# Patient Record
Sex: Male | Born: 1945 | Race: White | Hispanic: No | Marital: Married | Smoking: Current some day smoker
Health system: Southern US, Community
[De-identification: ages and names within clinical notes are randomized; demographics above are authoritative.]

## PROBLEM LIST (undated history)

## (undated) DIAGNOSIS — E785 Hyperlipidemia, unspecified: Secondary | ICD-10-CM

## (undated) HISTORY — PX: HIP SURGERY: SHX245

## (undated) HISTORY — PX: TONSILLECTOMY: SUR1361

## (undated) HISTORY — DX: Hyperlipidemia, unspecified: E78.5

---

## 2005-01-09 ENCOUNTER — Encounter (INDEPENDENT_AMBULATORY_CARE_PROVIDER_SITE_OTHER): Payer: Self-pay | Admitting: *Deleted

## 2005-01-09 ENCOUNTER — Ambulatory Visit (HOSPITAL_COMMUNITY): Admission: RE | Admit: 2005-01-09 | Discharge: 2005-01-09 | Payer: Self-pay | Admitting: Gastroenterology

## 2010-03-26 ENCOUNTER — Ambulatory Visit: Payer: Self-pay | Admitting: Emergency Medicine

## 2010-03-26 DIAGNOSIS — S81809A Unspecified open wound, unspecified lower leg, initial encounter: Secondary | ICD-10-CM

## 2010-03-26 DIAGNOSIS — S91009A Unspecified open wound, unspecified ankle, initial encounter: Secondary | ICD-10-CM

## 2010-03-26 DIAGNOSIS — S81009A Unspecified open wound, unspecified knee, initial encounter: Secondary | ICD-10-CM | POA: Insufficient documentation

## 2010-04-04 ENCOUNTER — Ambulatory Visit: Payer: Self-pay | Admitting: Emergency Medicine

## 2010-09-25 ENCOUNTER — Ambulatory Visit: Payer: Self-pay | Admitting: Emergency Medicine

## 2010-09-25 DIAGNOSIS — N453 Epididymo-orchitis: Secondary | ICD-10-CM | POA: Insufficient documentation

## 2010-09-25 LAB — CONVERTED CEMR LAB
Bilirubin Urine: NEGATIVE
Glucose, Urine, Semiquant: NEGATIVE
Nitrite: NEGATIVE
Specific Gravity, Urine: >=1.03
pH: 5

## 2010-09-26 ENCOUNTER — Encounter: Payer: Self-pay | Admitting: Emergency Medicine

## 2010-09-29 ENCOUNTER — Telehealth (INDEPENDENT_AMBULATORY_CARE_PROVIDER_SITE_OTHER): Payer: Self-pay | Admitting: *Deleted

## 2010-10-10 ENCOUNTER — Encounter
Admission: RE | Admit: 2010-10-10 | Discharge: 2010-10-10 | Payer: Self-pay | Source: Home / Self Care | Attending: Family Medicine | Admitting: Family Medicine

## 2010-11-04 NOTE — Assessment & Plan Note (Signed)
Summary: suture removal   Patient comes in today for suture removal.  His leg has healed up very well, no signs of infection.  Wound is closed with no dehiscence.  No tenderness, bruising and minimal erythema. Procedure: 5 sutures are removed with no difficulty. Patient advised to keep clean and dry and to return to clinic if any further problems.

## 2010-11-04 NOTE — Assessment & Plan Note (Signed)
Summary: Laceration- L Thigh x today proced rm   Vital Signs:  Patient Profile:   65 Years Old Male CC:      Laceration - L thigh x today Height:     72 inches Weight:      219 pounds O2 Sat:      100 % O2 treatment:    Room Air Temp:     97.1 degrees F oral Pulse rate:   80 / minute Pulse rhythm:   regular Resp:     16 per minute BP sitting:   135 / 90  (right arm) Cuff size:   regular  Vitals Entered By: Areta Haber CMA (March 26, 2010 6:12 PM)                  Current Allergies: No known allergies History of Present Illness Chief Complaint: Laceration - L thigh x today History of Present Illness: Cut left thigh with chainsaw today.  Last Td was 1 year ago.  Mild constant pain.  Bleeding has mostly stopped. No OTC's help, just compression.  Current Problems: OPEN WOUND KNEE LEG&ANK WITHOUT MENTION COMP (ICD-891.0)   Current Meds KEFLEX 500 MG CAPS (CEPHALEXIN) 1 tab by mouth two times a day x7 days  REVIEW OF SYSTEMS Constitutional Symptoms      Denies fever, chills, night sweats, weight loss, weight gain, and fatigue.  Eyes       Denies change in vision, eye pain, eye discharge, glasses, contact lenses, and eye surgery. Ear/Nose/Throat/Mouth       Denies hearing loss/aids, change in hearing, ear pain, ear discharge, dizziness, frequent runny nose, frequent nose bleeds, sinus problems, sore throat, hoarseness, and tooth pain or bleeding.  Respiratory       Denies dry cough, productive cough, wheezing, shortness of breath, asthma, bronchitis, and emphysema/COPD.  Cardiovascular       Denies murmurs, chest pain, and tires easily with exhertion.    Gastrointestinal       Denies stomach pain, nausea/vomiting, diarrhea, constipation, blood in bowel movements, and indigestion. Genitourniary       Denies painful urination, kidney stones, and loss of urinary control. Neurological       Denies paralysis, seizures, and fainting/blackouts. Musculoskeletal  Denies muscle pain, joint pain, joint stiffness, decreased range of motion, redness, swelling, muscle weakness, and gout.  Skin       Denies bruising, unusual mles/lumps or sores, and hair/skin or nail changes.  Psych       Denies mood changes, temper/anger issues, anxiety/stress, speech problems, depression, and sleep problems. Other Comments: Pt states he cut his L thight today on chainsaw. Pt states he had a Td last year.   Past History:  Past Medical History: Unremarkable  Past Surgical History: Dislocated hip  Social History: Married Current Smoker Alcohol use-no Drug use-no Regular exercise-yes Smoking Status:  current Drug Use:  no Does Patient Exercise:  yes Physical Exam General appearance: well developed, well nourished, no acute distress Chest/Lungs: no rales, wheezes, or rhonchi bilateral, breath sounds equal without effort Heart: regular rate and  rhythm, no murmur Extremities: normal extremities.  NOrmal function. Neurological: grossly intact and non-focal Skin: 6cm laceration anterior left distal thigh, not deep, only very minor bleeding.   Assessment New Problems: OPEN WOUND KNEE LEG&ANK WITHOUT MENTION COMP (ICD-891.0)   Plan New Medications/Changes: KEFLEX 500 MG CAPS (CEPHALEXIN) 1 tab by mouth two times a day x7 days  #14 x 0, 03/26/2010, Hoyt Koch MD  New Orders: New  Patient Level III [57846] Repair Laceration Intermediate 5.1-7.5 CM [12053]  The patient and/or caregiver has been counseled thoroughly with regard to medications prescribed including dosage, schedule, interactions, rationale for use, and possible side effects and they verbalize understanding.  Diagnoses and expected course of recovery discussed and will return if not improved as expected or if the condition worsens. Patient and/or caregiver verbalized understanding.   PROCEDURE:  Suture Site: left thigh Size: 6cm Number of Lacerations: 1 Procedure: Discussed benefits and  risks of procedure and verbal consent obtained.  Using sterile technique and local 1% lidocaine without epinephrine, cleansed wound with betadine followed by copious lavage with normal saline.  Wound carefully inspected for debris and foreign bodies; none found.  Wound closed with #5 , 3-0 interrupted nylon sutures.  Bacitracin and non-stick sterile dressing applied.  Wound precautions explained to patient.  Disposition: Return to clinic as Instructed by MD Prescriptions: KEFLEX 500 MG CAPS (CEPHALEXIN) 1 tab by mouth two times a day x7 days  #14 x 0   Entered and Authorized by:   Hoyt Koch MD   Signed by:   Hoyt Koch MD on 03/26/2010   Method used:   Print then Give to Patient   RxID:   [id number]   Patient Instructions: 1)  No getting the wound wet for 2 days, then may shower only.  Otherwise keep clean and dry.   2)  Stitches will need to be taken out in 10 days 3)  If developing an infection, increased pain, redness, or pus, start the antibiotics and call a physician  Orders Added: 1)  New Patient Level III [27253] 2)  Repair Laceration Intermediate 5.1-7.5 CM [12053]

## 2010-11-06 NOTE — Progress Notes (Signed)
  Phone Note Outgoing Call Call back at [facility] Phone [phone]   Call placed by: Lajean Saver RN,  September 29, 2010 5:45 PM Call placed to: Patient Summary of Call: CAllback: No answer. Message left with reason for call. Told patient to call back if still symptomatic.

## 2010-11-06 NOTE — Assessment & Plan Note (Signed)
Summary: "DISCOMFORT IN LOWER AREA"/TJ   Vital Signs:  Patient Profile:   65 Years Old Male CC:      discomfort in right testicle x last night Height:     72 inches Weight:      224 pounds O2 Sat:      96 % O2 treatment:    Room Air Temp:     98.9 degrees F oral Pulse rate:   67 / minute Resp:     18 per minute BP sitting:   142 / 90  (left arm) Cuff size:   large  Vitals Entered By: Lajean Saver RN (September 25, 2010 4:17 PM)                  Updated Prior Medication List: No Medications Current Allergies: No known allergies History of Present Illness History from: patient Chief Complaint: discomfort in right testicle x last night History of Present Illness: 64yo WM w/ R testicular pain since last night. No trauma. Not sexually active. Mild heavy lifting last week when helping a friend move.  He reports that the pain is mild-moderate and feels that he was "kicked in the groin".  Some mild difficulty starting urination and incomplete voiding.  No hematuria, no blood in the stool, no rectal pain.  Motrin helps a little.  No history of hernias.  REVIEW OF SYSTEMS Constitutional Symptoms      Denies fever, chills, night sweats, weight loss, weight gain, and fatigue.  Eyes       Denies change in vision, eye pain, eye discharge, glasses, contact lenses, and eye surgery. Ear/Nose/Throat/Mouth       Denies hearing loss/aids, change in hearing, ear pain, ear discharge, dizziness, frequent runny nose, frequent nose bleeds, sinus problems, sore throat, hoarseness, and tooth pain or bleeding.  Respiratory       Denies dry cough, productive cough, wheezing, shortness of breath, asthma, bronchitis, and emphysema/COPD.  Cardiovascular       Denies murmurs, chest pain, and tires easily with exhertion.    Gastrointestinal       Denies stomach pain, nausea/vomiting, diarrhea, constipation, blood in bowel movements, and indigestion. Genitourniary       Denies painful urination, blood  or discharge from penis, kidney stones, and loss of urinary control.      Comments: testicular pain- right sided Neurological       Denies paralysis, seizures, and fainting/blackouts. Musculoskeletal       Denies muscle pain, joint pain, joint stiffness, decreased range of motion, redness, swelling, muscle weakness, and gout.  Skin       Denies bruising, unusual mles/lumps or sores, and hair/skin or nail changes.  Psych       Denies mood changes, temper/anger issues, anxiety/stress, speech problems, depression, and sleep problems. Other Comments: Denies any urianry issues.   Past History:  Past Medical History: Reviewed history from 03/26/2010 and no changes required. Unremarkable  Social History: Reviewed history from 03/26/2010 and no changes required. Married Current Smoker Alcohol use-no Drug use-no Regular exercise-yes Physical Exam General appearance: well developed, well nourished, no acute distress Chest/Lungs: no rales, wheezes, or rhonchi bilateral, breath sounds equal without effort Heart: regular rate and  rhythm, no murmur GU: Mild TTP R epididymis, perhaps small firm nodule felt, no direct or indirect hernia felt Skin: no obvious rashes or lesions MSE: oriented to time, place, and person Assessment New Problems: EPIDIDYMITIS, RIGHT (ICD-604.90)  R testicular pain: DDx includes epididymitis vs hernia vs torsion vs kidney  stone  Patient Education: Patient and/or caregiver instructed in the following: rest, Ibuprofen prn.  Plan New Medications/Changes: CIPROFLOXACIN HCL 500 MG TABS (CIPROFLOXACIN HCL) 1 by mouth two times a day for 14 days  #28 x 8, 09/25/2010, Hoyt Koch MD  New Orders: Est. Patient Level III [phone] Planning Comments:   I suggested patient have a testicular ultrasound done to check on possible nodule and to r/o torsion.  He refused.  I gave him a Rx for Cipro to cover epipiymitis/orchitis/prostatitis/UTI as infectious etiologies.   Increase hydration and Ibuprofen prn for pain.  Please follow up with your PCP.  If pain worsening, needs to get Ultrasound +/- referral to urologist.  Urine culture pending.   The patient and/or caregiver has been counseled thoroughly with regard to medications prescribed including dosage, schedule, interactions, rationale for use, and possible side effects and they verbalize understanding.  Diagnoses and expected course of recovery discussed and will return if not improved as expected or if the condition worsens. Patient and/or caregiver verbalized understanding.  Prescriptions: CIPROFLOXACIN HCL 500 MG TABS (CIPROFLOXACIN HCL) 1 by mouth two times a day for 14 days  #28 x 8   Entered and Authorized by:   Hoyt Koch MD   Signed by:   Hoyt Koch MD on 09/25/2010   Method used:   Print then Give to Patient   RxID:   [id number]   Orders Added: 1)  Est. Patient Level III [95621]    Laboratory Results   Urine Tests  Date/Time Received: September 25, 2010 4:27 PM  Date/Time Reported: September 25, 2010 4:27 PM   Routine Urinalysis   Color: yellow Appearance: slightly cloudy with sediment Glucose: negative   (Normal Range: Negative) Bilirubin: negative   (Normal Range: Negative) Ketone: trace (5)   (Normal Range: Negative) Spec. Gravity: >=1.030   (Normal Range: 1.003-1.035) Blood: trace-lysed   (Normal Range: Negative) pH: 5.0   (Normal Range: 5.0-8.0) Protein: negative   (Normal Range: Negative) Urobilinogen: 0.2   (Normal Range: 0-1) Nitrite: negative   (Normal Range: Negative) Leukocyte Esterace: negative   (Normal Range: Negative)

## 2011-02-20 NOTE — Op Note (Signed)
NAME:  Sean Mullen, FEDEWA NO.:  [id number]   MEDICAL RECORD NO.:  [id number]          PATIENT TYPE:  AMB   LOCATION:  ENDO                         FACILITY:  [facility]   PHYSICIAN:  John C. Madilyn Fireman, M.D.    DATE OF BIRTH:  [birth year]   DATE OF PROCEDURE:  01/09/2005  DATE OF DISCHARGE:                                 OPERATIVE REPORT   PROCEDURE:  Colonoscopy with polypectomy.   INDICATIONS FOR PROCEDURE:  Average risk colon cancer screening.   PROCEDURE:  The patient was placed in the left lateral decubitus position  and placed on the pulse monitor with continuous low-flow oxygen delivered by  nasal cannula.  He was sedated with 100 mcg IV fentanyl and 10 mg IV Versed.  The Olympus video colonoscope was inserted into the rectum and advanced to  the cecum, confirmed by transillumination of McBurney's point and  visualization of the ileocecal valve and appendiceal orifice.  The prep was  excellent.  The cecum, ascending, transverse, descending colon all appeared  normal with no masses, polyps, diverticula or other mucosal abnormalities.  Within the sigmoid colon, there was an 8 mm polyp that was removed by hot  biopsy.  The remainder of the rectum appeared normal.  The scope was then  withdrawn and the patient returned to the recovery room in stable condition.  He tolerated the procedure well, and there were no immediate complications.   IMPRESSION:  Normal colonoscopy.   PLAN:  Await histology will probably repeat study in 3 years.      J[facility]/MEDQ  D:  01/09/2005  T:  01/09/2005  Job:  [id number]   cc:   Dellis Anes. Idell Pickles, M.D.  [address]  [place]  [place] [address]  Fax: [phone]

## 2012-08-04 ENCOUNTER — Encounter: Payer: Self-pay | Admitting: *Deleted

## 2012-08-04 ENCOUNTER — Emergency Department
Admission: EM | Admit: 2012-08-04 | Discharge: 2012-08-04 | Disposition: A | Discharge status: Home / Self Care | Payer: Medicare Other | Source: Home / Self Care | Attending: Family Medicine | Admitting: Family Medicine

## 2012-08-04 DIAGNOSIS — S336XXA Sprain of sacroiliac joint, initial encounter: Secondary | ICD-10-CM

## 2012-08-04 DIAGNOSIS — S335XXA Sprain of ligaments of lumbar spine, initial encounter: Secondary | ICD-10-CM

## 2012-08-04 MED ORDER — CYCLOBENZAPRINE HCL 10 MG PO TABS: ORAL_TABLET | ORAL | Status: DC

## 2012-08-04 MED ORDER — NAPROXEN 500 MG PO TABS: 500.0000 mg | ORAL_TABLET | Freq: Two times a day (BID) | ORAL | Status: DC

## 2012-08-04 NOTE — ED Provider Notes (Signed)
History     CSN: [id number]  Arrival date & time 08/04/12  1455   First MD Initiated Contact with Patient 08/04/12 1513      Chief Complaint  Patient presents with  . Back Pain      HPI Comments: Patient played golf yesterday.  While he was swinging his driver, he felt a sudden pulling sensation in his right lower back.  The pain is present only when he twists his upper body to the left.  Patient is a 65 y.o. male presenting with back pain. The history is provided by the patient.  Back Pain  This is a new problem. The current episode started yesterday. The problem occurs hourly. The problem has not changed since onset.Associated with: playing golf. The pain is present in the sacro-iliac joint. The quality of the pain is described as stabbing. The pain does not radiate. The pain is at a severity of 6/10. The pain is moderate. The symptoms are aggravated by twisting. The pain is worse during the day. Pertinent negatives include no chest pain, no fever, no numbness, no abdominal pain, no bowel incontinence, no perianal numbness, no bladder incontinence, no dysuria, no leg pain, no paresthesias, no paresis, no tingling and no weakness. He has tried nothing for the symptoms.    History reviewed. No pertinent past medical history.  Past Surgical History  Procedure Date  . Tonsillectomy   . Hip surgery     Family history:  Father pancreatic cancer  History  Substance Use Topics  . Smoking status: Current Every Day Smoker -- 0.2 packs/day  . Smokeless tobacco: Never Used  . Alcohol Use:       Review of Systems  Constitutional: Negative for fever.  Cardiovascular: Negative for chest pain.  Gastrointestinal: Negative for abdominal pain and bowel incontinence.  Genitourinary: Negative for bladder incontinence and dysuria.  Musculoskeletal: Positive for back pain.  Neurological: Negative for tingling, weakness, numbness and paresthesias.    Allergies  Review of patient's  allergies indicates no known allergies.  Home Medications   Current Outpatient Rx  Name Route Sig Dispense Refill  . CYCLOBENZAPRINE HCL 10 MG PO TABS  Take one tab by mouth at bedtime for muscle spasm 10 tablet 1  . NAPROXEN 500 MG PO TABS Oral Take 1 tablet (500 mg total) by mouth 2 (two) times daily. (every 12 hours with food) 20 tablet 0    BP 132/98  Pulse 72  Temp 98.2 F (36.8 C) (Oral)  Resp 20  Ht 6\' 1"  (1.854 m)  Wt 213 lb (96.616 kg)  BMI 28.10 kg/m2  SpO2 93%  Physical Exam Nursing notes and Vital Signs reviewed. Appearance:  Patient appears healthy, stated age, and in no acute distress Eyes:  Pupils are equal, round, and reactive to light and accomodation.  Extraocular movement is intact.  Conjunctivae are not inflamed   Pharynx:  Normal Neck:  Supple.   No adenopathy Lungs:  Clear to auscultation.  Breath sounds are equal.  Heart:  Regular rate and rhythm without murmurs, rubs, or gallops.  Abdomen:  Nontender without masses or hepatosplenomegaly.   Extremities:  No edema.  No calf tenderness Skin:  No rash present.  Back:  Full range of motion.  There is localized tenderness in the right paraspinous muscles over th Sacral area.  Straight leg raising test is negative.  Sitting knee extension test is negative.  Strength and sensation in the lower extremities is normal.  Patellar and achilles reflexes are  normal   ED Course  Procedures none      1. Low back sprain       MDM  Begin Naproxen, and Flexeril at bedtime. Apply ice pack 3 or 4 times daily for about 30 minutes.  Begin stretching exercises as per instruction sheet ([facility] information and instruction handout given)  Followup with Dr. Rodney Langton if not improved two weeks.        Lattie Haw, MD 08/04/12 [phone]

## 2012-08-04 NOTE — ED Notes (Signed)
Patient c/o back pain started yesterday. Went to golf course and thinks he threw out back.

## 2014-09-19 ENCOUNTER — Other Ambulatory Visit: Payer: Self-pay

## 2015-02-15 ENCOUNTER — Other Ambulatory Visit: Payer: Self-pay

## 2018-08-31 ENCOUNTER — Emergency Department (INDEPENDENT_AMBULATORY_CARE_PROVIDER_SITE_OTHER): Payer: Medicare Other

## 2018-08-31 ENCOUNTER — Emergency Department
Admission: EM | Admit: 2018-08-31 | Discharge: 2018-08-31 | Disposition: A | Discharge status: Home / Self Care | Payer: Medicare Other | Source: Home / Self Care | Attending: Family Medicine | Admitting: Family Medicine

## 2018-08-31 ENCOUNTER — Other Ambulatory Visit: Payer: Self-pay

## 2018-08-31 DIAGNOSIS — M1711 Unilateral primary osteoarthritis, right knee: Secondary | ICD-10-CM

## 2018-08-31 DIAGNOSIS — S8391XA Sprain of unspecified site of right knee, initial encounter: Secondary | ICD-10-CM

## 2018-08-31 NOTE — Discharge Instructions (Addendum)
Wear knee brace.  Apply ice pack for 20 to 30 minutes, 3 to 4 times daily  Continue until pain and swelling decrease.  May take Ibuprofen 200mg , 4 tabs every 8 hours with food.  Begin knee range of motion and stretching exercises as tolerated.

## 2018-08-31 NOTE — ED Provider Notes (Signed)
[name]    CSN: [id number] Arrival date & time: 08/31/18  1053     History   Chief Complaint Chief Complaint  Patient presents with  . Knee Pain    HPI Sean Mullen is a 72 y.o. male.   While exiting his truck yesterday, patient twisted his right knee.  He has had persistent posterior knee pain, worse with knee flexion.  The history is provided by the patient.  Knee Pain  Location:  Knee Time since incident:  1 day Injury: yes   Mechanism of injury comment:  Twisted Pain details:    Quality:  Aching   Radiates to:  Does not radiate   Severity:  Mild   Onset quality:  Sudden   Duration:  1 day   Timing:  Constant   Progression:  Unchanged Chronicity:  New Prior injury to area:  No Relieved by:  Nothing Worsened by:  Flexion Ineffective treatments: knee brace. Associated symptoms: decreased ROM, stiffness and swelling   Associated symptoms: no back pain, no muscle weakness, no numbness and no tingling     History reviewed. No pertinent past medical history.  Patient Active Problem List   Diagnosis Date Noted  . EPIDIDYMITIS, RIGHT 09/25/2010  . OPEN WOUND KNEE LEG&ANK WITHOUT MENTION COMP 03/26/2010    Past Surgical History:  Procedure Laterality Date  . HIP SURGERY    . TONSILLECTOMY         Home Medications    Prior to Admission medications   Medication Sig Start Date End Date Taking? Authorizing Provider  cyclobenzaprine (FLEXERIL) 10 MG tablet Take one tab by mouth at bedtime for muscle spasm 08/04/12   Kandra Nicolas, MD  naproxen (NAPROSYN) 500 MG tablet Take 1 tablet (500 mg total) by mouth 2 (two) times daily. (every 12 hours with food) 08/04/12   Kandra Nicolas, MD    Family History History reviewed. No pertinent family history.  Social History Social History   Tobacco Use  . Smoking status: Current Every Day Smoker    Packs/day: 0.25  . Smokeless tobacco: Never Used  Substance Use Topics  . Alcohol use:  Not on file  . Drug use: No     Allergies   Patient has no known allergies.   Review of Systems Review of Systems  Musculoskeletal: Positive for stiffness. Negative for back pain.  All other systems reviewed and are negative.    Physical Exam Triage Vital Signs ED Triage Vitals  Enc Vitals Group     BP 08/31/18 1125 132/84     Pulse Rate 08/31/18 1125 66     Resp 08/31/18 1125 18     Temp 08/31/18 1125 97.7 F (36.5 C)     Temp Source 08/31/18 1125 Oral     SpO2 08/31/18 1125 96 %     Weight 08/31/18 1126 196 lb (88.9 kg)     Height 08/31/18 1126 6' (1.829 m)     Head Circumference --      Peak Flow --      Pain Score 08/31/18 1126 3     Pain Loc --      Pain Edu? --      Excl. in [place]? --    No data found.  Updated Vital Signs BP 132/84 (BP Location: Left Arm)   Pulse 66   Temp 97.7 F (36.5 C) (Oral)   Resp 18   Ht 6' (1.829 m)   Wt 88.9 kg  SpO2 96%   BMI 26.58 kg/m   Visual Acuity Right Eye Distance:   Left Eye Distance:   Bilateral Distance:    Right Eye Near:   Left Eye Near:    Bilateral Near:     Physical Exam  Constitutional: He appears well-developed and well-nourished. No distress.  HENT:  Head: Atraumatic.  Eyes: Pupils are equal, round, and reactive to light. Conjunctivae are normal.  Cardiovascular: Normal rate.  Pulmonary/Chest: Effort normal.  Musculoskeletal: He exhibits no edema.       Right knee: He exhibits decreased range of motion and swelling. He exhibits no ecchymosis, no deformity, normal alignment and no LCL laxity. Tenderness found. Lateral joint line tenderness noted. No patellar tendon tenderness noted.       Legs: Right knee:  No effusion, erythema, or warmth.  Knee stable, negative drawer test.  McMurray test negative.  Mild tenderness to palpation over LCL.   Neurological: He is alert.  Skin: Skin is warm and dry.  Nursing note and vitals reviewed.    UC Treatments / Results  Labs (all labs ordered are  listed, but only abnormal results are displayed) Labs Reviewed - No data to display  EKG None  Radiology Dg Knee Complete 4 Views Right  Result Date: 08/31/2018 CLINICAL DATA:  Right knee pain with tenderness and swelling since a twisting injury yesterday. EXAM: RIGHT KNEE - COMPLETE 4+ VIEW COMPARISON:  None. FINDINGS: There is no fracture or dislocation but there is a moderate joint effusion. There is also moderate osteoarthritis of the medial and patellofemoral compartments with marginal osteophyte formation. IMPRESSION: No acute osseous abnormality. Moderate joint effusion. Tricompartmental osteoarthritis. Electronically Signed   By: Lorriane Shire M.D.   On: 08/31/2018 12:07    Procedures Procedures (including critical care time)  Medications Ordered in UC Medications - No data to display  Initial Impression / Assessment and Plan / UC Course  I have reviewed the triage vital signs and the nursing notes.  Pertinent labs & imaging results that were available during my care of the patient were reviewed by me and considered in my medical decision making (see chart for details).    Dispensed hinged knee brace. Followup with Dr. Aundria Mems or Dr. Lynne Leader ([facility]) if not improving about two weeks.    Final Clinical Impressions(s) / UC Diagnoses   Final diagnoses:  Sprain of right knee, unspecified ligament, initial encounter     Discharge Instructions     Wear knee brace.  Apply ice pack for 20 to 30 minutes, 3 to 4 times daily  Continue until pain and swelling decrease.  May take Ibuprofen 200mg , 4 tabs every 8 hours with food.  Begin knee range of motion and stretching exercises as tolerated.    ED Prescriptions    None        Kandra Nicolas, MD 08/31/18 1242

## 2018-08-31 NOTE — ED Triage Notes (Signed)
Pt was getting out of the truck yesterday at [facility], and twisted right knee.  Having pain behind the knee

## 2018-09-03 ENCOUNTER — Telehealth: Payer: Self-pay | Admitting: Emergency Medicine

## 2018-09-03 NOTE — Telephone Encounter (Signed)
Patient was asking for a return call regarding his knee visit on 08/31/2018.  Advised to take the Ibuprofen every 8 hours as needed, ice the knee when he has it elevated and to wear his brace when he is up moving around.  All questions answered, patient voices understanding.  Will follow up as needed.

## 2019-09-14 ENCOUNTER — Encounter: Payer: Self-pay | Admitting: Emergency Medicine

## 2019-09-14 ENCOUNTER — Other Ambulatory Visit: Payer: Self-pay

## 2019-09-14 ENCOUNTER — Emergency Department
Admission: EM | Admit: 2019-09-14 | Discharge: 2019-09-14 | Disposition: A | Discharge status: Home / Self Care | Payer: Medicare Other | Source: Home / Self Care | Attending: Family Medicine | Admitting: Family Medicine

## 2019-09-14 DIAGNOSIS — H6123 Impacted cerumen, bilateral: Secondary | ICD-10-CM | POA: Diagnosis not present

## 2019-09-14 MED ORDER — NEOMYCIN-POLYMYXIN-HC 3.5-10000-1 OT SUSP: 4.0000 [drp] | Freq: Three times a day (TID) | OTIC | 0 refills | Status: DC

## 2019-09-14 NOTE — ED Triage Notes (Signed)
Bi-lateral ears clogged x 4 days, chronic

## 2019-09-14 NOTE — Discharge Instructions (Addendum)
To prevent recurrent ear wax blockage, try the following: Soak two cotton balls with mineral oil, and gently place in each ear canal once weekly.  Leave the cotton balls in place for 10 to 20 minutes.  This will help liquefy the ear wax and aid your body's normal elimination process.  If applicable, do not use a hearing aid for 8 hours overnight.  Have your ears cleaned by a health professional every 6 to 12 months.  Avoid using "Q-tips" and ear wax softening solutions  

## 2019-09-14 NOTE — ED Provider Notes (Signed)
[name]    CSN: [id number] Arrival date & time: 09/14/19  1446      History   Chief Complaint Chief Complaint  Patient presents with  . Ear Problem    HPI Sean Mullen is a 73 y.o. male.   Patient complains of both ears feeling clogged for four days.  He feels well otherwise.  No recent URI or sinus congestion.  The history is provided by the patient.    History reviewed. No pertinent past medical history.  Patient Active Problem List   Diagnosis Date Noted  . EPIDIDYMITIS, RIGHT 09/25/2010  . OPEN WOUND KNEE LEG&ANK WITHOUT MENTION COMP 03/26/2010    Past Surgical History:  Procedure Laterality Date  . HIP SURGERY    . TONSILLECTOMY         Home Medications    Prior to Admission medications   Medication Sig Start Date End Date Taking? Authorizing Provider  neomycin-polymyxin-hydrocortisone (CORTISPORIN) 3.5-10000-1 OTIC suspension Place 4 drops into the right ear 3 (three) times daily. Use for 4 to 5 days 09/14/19   Kandra Nicolas, MD    Family History Family History  Problem Relation Age of Onset  . Cancer Father     Social History Social History   Tobacco Use  . Smoking status: Current Every Day Smoker    Packs/day: 0.25  . Smokeless tobacco: Never Used  Substance Use Topics  . Alcohol use: Yes  . Drug use: No     Allergies   Patient has no known allergies.   Review of Systems Review of Systems  Constitutional: Negative for appetite change, chills, diaphoresis and fatigue.  HENT: Positive for hearing loss. Negative for congestion, ear discharge and ear pain.   Eyes: Negative.   Respiratory: Negative.   Cardiovascular: Negative.   Gastrointestinal: Negative.   Musculoskeletal: Negative.   Skin: Negative.   Neurological: Negative.      Physical Exam Triage Vital Signs ED Triage Vitals  Enc Vitals Group     BP 09/14/19 1504 (!) 151/94     Pulse Rate 09/14/19 1504 82     Resp --      Temp 09/14/19 1504  98.2 F (36.8 C)     Temp Source 09/14/19 1504 Oral     SpO2 09/14/19 1504 96 %     Weight 09/14/19 1505 220 lb (99.8 kg)     Height 09/14/19 1505 6' (1.829 m)     Head Circumference --      Peak Flow --      Pain Score 09/14/19 1505 0     Pain Loc --      Pain Edu? --      Excl. in [place]? --    No data found.  Updated Vital Signs BP (!) 151/94 (BP Location: Right Arm)   Pulse 82   Temp 98.2 F (36.8 C) (Oral)   Ht 6' (1.829 m)   Wt 99.8 kg   SpO2 96%   BMI 29.84 kg/m   Visual Acuity Right Eye Distance:   Left Eye Distance:   Bilateral Distance:    Right Eye Near:   Left Eye Near:    Bilateral Near:     Physical Exam Vitals and nursing note reviewed.  Constitutional:      General: He is not in acute distress. HENT:     Head: Normocephalic.     Right Ear: External ear normal. There is impacted cerumen.     Left Ear: External  ear normal. There is impacted cerumen.     Ears:     Comments: Both canals occluded with cerumen.  Post lavage, both tympanic membranes appear normal.  Right canal is erythematous.    Nose: Nose normal.     Mouth/Throat:     Pharynx: Oropharynx is clear.  Eyes:     Pupils: Pupils are equal, round, and reactive to light.  Cardiovascular:     Rate and Rhythm: Normal rate.  Pulmonary:     Effort: Pulmonary effort is normal.  Lymphadenopathy:     Cervical: No cervical adenopathy.  Skin:    General: Skin is warm and dry.  Neurological:     Mental Status: He is alert.      UC Treatments / Results  Labs (all labs ordered are listed, but only abnormal results are displayed) Labs Reviewed - No data to display  EKG   Radiology No results found.  Procedures Procedures (including critical care time)  Medications Ordered in UC Medications - No data to display  Initial Impression / Assessment and Plan / UC Course  I have reviewed the triage vital signs and the nursing notes.  Pertinent labs & imaging results that were available  during my care of the patient were reviewed by me and considered in my medical decision making (see chart for details).    Note erythema right canal; will begin Cortisporin Otic suspension to right ear for 4 to 5 days. Followup with Family Doctor if ear pain develops.  Final Clinical Impressions(s) / UC Diagnoses   Final diagnoses:  Bilateral impacted cerumen     Discharge Instructions     To prevent recurrent ear wax blockage, try the following: Soak two cotton balls with mineral oil, and gently place in each ear canal once weekly.  Leave the cotton balls in place for 10 to 20 minutes.  This will help liquefy the ear wax and aid your body's normal elimination process.  If applicable, do not use a hearing aid for 8 hours overnight.  Have your ears cleaned by a health professional every 6 to 12 months.  Avoid using "Q-tips" and ear wax softening solutions     ED Prescriptions    Medication Sig Dispense Auth. Provider   neomycin-polymyxin-hydrocortisone (CORTISPORIN) 3.5-10000-1 OTIC suspension Place 4 drops into the right ear 3 (three) times daily. Use for 4 to 5 days 7.5 mL Kandra Nicolas, MD        Kandra Nicolas, MD 09/16/19 [phone]

## 2020-02-22 ENCOUNTER — Emergency Department (INDEPENDENT_AMBULATORY_CARE_PROVIDER_SITE_OTHER): Payer: Medicare Other

## 2020-02-22 ENCOUNTER — Other Ambulatory Visit: Payer: Self-pay

## 2020-02-22 ENCOUNTER — Emergency Department (INDEPENDENT_AMBULATORY_CARE_PROVIDER_SITE_OTHER)
Admission: EM | Admit: 2020-02-22 | Discharge: 2020-02-22 | Disposition: A | Discharge status: Home / Self Care | Payer: Medicare Other | Source: Home / Self Care

## 2020-02-22 DIAGNOSIS — M1711 Unilateral primary osteoarthritis, right knee: Secondary | ICD-10-CM

## 2020-02-22 DIAGNOSIS — M25561 Pain in right knee: Secondary | ICD-10-CM

## 2020-02-22 DIAGNOSIS — M25461 Effusion, right knee: Secondary | ICD-10-CM

## 2020-02-22 NOTE — ED Triage Notes (Signed)
Pt c/o RT leg pain since lunchtime today. Says pain is located on back of knee and radiates both up and down leg. Pain 7/10 Tylenol 500mg  at 2pm, no relief. Denies injury.

## 2020-02-22 NOTE — Discharge Instructions (Signed)
  You may take 500mg  acetaminophen (Tylenol) every 4-6 hours or in combination with ibuprofen 400-600mg  every 6-8 hours as needed for pain and inflammation.  If you already have pain medication at home from over the counter, be sure to follow instructions on the product packaging or ask your pharmacist for assistance.  You may also call our office back if you have specific questions about your medication, be sure to have the dose and name of the medication available so we can help.    You can alternate cool and warm compresses to help with pain.  Because you injured this same knee last year, it is recommended you call to schedule a follow up appointment with Sports Medicine next week for further evaluation and treatment of knee pain.  There is a low risk of a blood clot, however, because the pain is behind your knee, an ultrasound of your leg was offered today. You have declined to have an ultrasound scheduled, however, if you develop worsening pain, swelling, or develop bruising or redness in your leg, please follow up again so you can have an ultrasound to make sure you do not have a blood clot in your leg or a cyst behind your knee contributing to your pain.

## 2020-02-22 NOTE — ED Provider Notes (Signed)
[name]    CSN: [id number] Arrival date & time: 02/22/20  1746      History   Chief Complaint Chief Complaint  Patient presents with  . Leg Pain    RT    HPI Sean Mullen is a 74 y.o. male.   HPI  Sean Mullen is a 74 y.o. male presenting to UC with c/o Right knee pain that started after getting [place] around 12pm today.  Denies known injury.  Pain is aching, 7/10. He noticed mild swelling, no bruising or redness.  He took Tylenol 500mg  at [facility] w/o relief.  Per medical records, pt sprained the same knee November last year.  He has not f/u with sports medicine or an orthopedist for his knee because the pain from last incident resolved.  No hx of gout or blood clots.     History reviewed. No pertinent past medical history.  Patient Active Problem List   Diagnosis Date Noted  . EPIDIDYMITIS, RIGHT 09/25/2010  . OPEN WOUND KNEE LEG&ANK WITHOUT MENTION COMP 03/26/2010    Past Surgical History:  Procedure Laterality Date  . HIP SURGERY    . TONSILLECTOMY         Home Medications    Prior to Admission medications   Medication Sig Start Date End Date Taking? Authorizing Provider  neomycin-polymyxin-hydrocortisone (CORTISPORIN) 3.5-10000-1 OTIC suspension Place 4 drops into the right ear 3 (three) times daily. Use for 4 to 5 days 09/14/19   Kandra Nicolas, MD  simvastatin (ZOCOR) 10 MG tablet SMARTSIG:1 Tablet(s) By Mouth Every Evening 01/15/20   [provider]    Family History Family History  Problem Relation Age of Onset  . Cancer Father     Social History Social History   Tobacco Use  . Smoking status: Current Every Day Smoker    Packs/day: 0.25  . Smokeless tobacco: Never Used  Substance Use Topics  . Alcohol use: Yes  . Drug use: No     Allergies   Patient has no known allergies.   Review of Systems Review of Systems  Musculoskeletal: Positive for arthralgias and joint swelling.  Negative for gait problem and myalgias.  Skin: Negative for color change and wound.     Physical Exam Triage Vital Signs ED Triage Vitals  Enc Vitals Group     BP 02/22/20 1757 (!) 158/95     Pulse Rate 02/22/20 1757 83     Resp 02/22/20 1757 20     Temp --      Temp src --      SpO2 02/22/20 1757 96 %     Weight --      Height --      Head Circumference --      Peak Flow --      Pain Score 02/22/20 1758 7     Pain Loc --      Pain Edu? --      Excl. in [place]? --    No data found.  Updated Vital Signs BP (!) 158/95 (BP Location: Left Arm)   Pulse 83   Resp 20   SpO2 96%   Visual Acuity Right Eye Distance:   Left Eye Distance:   Bilateral Distance:    Right Eye Near:   Left Eye Near:    Bilateral Near:     Physical Exam Vitals and nursing note reviewed.  Constitutional:      Appearance: He is well-developed.  HENT:     Head: Normocephalic and atraumatic.  Cardiovascular:     Rate and Rhythm: Normal rate.  Pulmonary:     Effort: Pulmonary effort is normal.  Musculoskeletal:        General: Swelling and tenderness present. Normal range of motion.     Cervical back: Normal range of motion.     Comments: Right knee: mild edema, tenderness to lateral and posterior aspect. Full ROM. Calf is soft, non-tender. Muscle compartments soft.  Skin:    General: Skin is warm and dry.     Findings: No bruising or erythema.  Neurological:     Mental Status: He is alert and oriented to person, place, and time.  Psychiatric:        Behavior: Behavior normal.      UC Treatments / Results  Labs (all labs ordered are listed, but only abnormal results are displayed) Labs Reviewed - No data to display  EKG   Radiology CLINICAL DATA:  Right knee pain, onset today, posteriorly rating both up and down leg. No known injury.  EXAM: RIGHT KNEE - COMPLETE 4+ VIEW  COMPARISON:  Radiograph 08/31/2018  FINDINGS: Tricompartmental osteoarthritis with peripheral  spurring. Medial tibiofemoral joint space narrowing. Degenerative changes are not significantly changed from 2019 exam. No erosion, periosteal reaction, or bony destruction. No fracture. Small knee joint effusion, less prominent than on prior. Small patellar tendon enthesophyte.  IMPRESSION: Unchanged tricompartmental osteoarthritis compared to 2019 exam. Small knee joint effusion, less prominent than on prior.  No acute findings.   Electronically Signed   By: Keith Rake M.D.   On: 02/22/2020 18:49  Procedures Procedures (including critical care time)  Medications Ordered in UC Medications - No data to display  Initial Impression / Assessment and Plan / UC Course  I have reviewed the triage vital signs and the nursing notes.  Pertinent labs & imaging results that were available during my care of the patient were reviewed by me and considered in my medical decision making (see chart for details).     Discussed imaging with pt Due to pain being in posterior aspect of knee, discussed further evaluation with [place]. Ultrasound not available this evening, however, offered to send pt to another facility or order for tomorrow, Friday 5/21, pt declined Pt would like to try knee brace again. Encouraged f/u with Sports Medicine, or call if he changes mind on [place] AVS provided  Final Clinical Impressions(s) / UC Diagnoses   Final diagnoses:  Acute pain of right knee  Tricompartment osteoarthritis of right knee  Effusion of knee joint right     Discharge Instructions      You may take 500mg  acetaminophen (Tylenol) every 4-6 hours or in combination with ibuprofen 400-600mg  every 6-8 hours as needed for pain and inflammation.  If you already have pain medication at home from over the counter, be sure to follow instructions on the product packaging or ask your pharmacist for assistance.  You may also call our office back if you have specific questions about your medication, be  sure to have the dose and name of the medication available so we can help.    You can alternate cool and warm compresses to help with pain.  Because you injured this same knee last year, it is recommended you call to schedule a follow up appointment with Sports Medicine next week for further evaluation and treatment of knee pain.  There is a low risk of a blood clot, however, because  the pain is behind your knee, an ultrasound of your leg was offered today. You have declined to have an ultrasound scheduled, however, if you develop worsening pain, swelling, or develop bruising or redness in your leg, please follow up again so you can have an ultrasound to make sure you do not have a blood clot in your leg or a cyst behind your knee contributing to your pain.      ED Prescriptions    None     PDMP not reviewed this encounter.   Noe Gens, [place] 02/26/20 [phone]

## 2020-02-23 ENCOUNTER — Telehealth: Payer: Self-pay | Admitting: Emergency Medicine

## 2020-02-23 NOTE — Telephone Encounter (Signed)
Confirmed name & DOB- pt calling to see what he should do this morning about his right foot. Per pt his right foot felt cold to touch & his wife agreed. Pt also thought he felt a cramp in his right leg. Pt was seen at here at [facility] last night & had refused an ultrasound at that time. Writer instructed patient to go to the closest hospital of his choice due to color change and coldness to extremity as he was instructed to with any changes at discharge last night (02/22/20). Ultrasound not available at [facility] today and due to vascular changes, patient needed a higher level of care via the hospital. Patient verbalized an understanding &  stated his wife would drive him to the hospital when he concluded call. Writer stated she would call and check on patient later on to be sure patient had gone to hospital.

## 2020-02-23 NOTE — Telephone Encounter (Signed)
Call to pt's phone - his wife answered - follow up on call from earlier in the day - name and DOB confirmed. Per pt's wife they had just returned from [facility] Emergency Room. Pt has a "blockage in his right leg and has an appointment with a vein specialist next week" Pt had an ultrasound and CT scan in the ER today. Pt's wife thanked me  for calling and checking on her husband and for our care last night.

## 2020-11-28 DIAGNOSIS — S2232XA Fracture of one rib, left side, initial encounter for closed fracture: Secondary | ICD-10-CM | POA: Diagnosis not present

## 2020-11-28 DIAGNOSIS — J9811 Atelectasis: Secondary | ICD-10-CM | POA: Diagnosis not present

## 2020-11-28 DIAGNOSIS — S20212A Contusion of left front wall of thorax, initial encounter: Secondary | ICD-10-CM | POA: Diagnosis not present

## 2020-12-02 DIAGNOSIS — S2232XD Fracture of one rib, left side, subsequent encounter for fracture with routine healing: Secondary | ICD-10-CM | POA: Diagnosis not present

## 2020-12-02 DIAGNOSIS — R2689 Other abnormalities of gait and mobility: Secondary | ICD-10-CM | POA: Diagnosis not present

## 2020-12-04 NOTE — Progress Notes (Signed)
Assessment/Plan:   1.  Chorea  -Discussed with the patient that chorea is just a symptom.  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  However, this usually presents before the age of 75 years old.  it is unclear if this was present this early.  It is very clear that this runs in the family.  I think that Huntington's disease has to be on the differential, especially if it is getting worse.  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  The patient is pretty clear that he does not want a significant work-up.  I told the patient that if he has nothing else done but I think he would be wise to have genetic testing for Huntington's disease, especially given the implications for his son who also has symptoms.  He was agreeable to this.  We decided to hold off on any other blood tests or MRI, which are generally indicated for the work-up of chorea.  We can address this in the future if he would like.  Subjective:   Sean Mullen was seen today in the movement disorders clinic for neurologic consultation at the request of Lawerance Cruel, MD.  The consultation is for the evaluation of falls and abnormal movements, right greater than left.  Outside records that were made available to me were reviewed.  Pt with spouse who supplements the history.  Pt is clear that he doesn't want to be here.  Pt states that he doesn't notice movements but wife does.  pts wife states that she has noted movements for "50 years - as long as I have known him."  Pt states that sister and cousins have similar movements and were told that "it runs in the family."  No known diagnosis.  State that son does it as well (he is 50 y/o).    Wife states that it has increased a bit over the years.     Specific Symptoms:  Voice: no changes per pt; wife thinks he is a little  harder to understand Sleep: sleeps well with cpap  Vivid Dreams:  No.  Acting out dreams:  No. Wet Pillows: No. Postural symptoms:  Yes per wife  Falls?  Yes.  Had recent rib fracture as the result of recent fall Bradykinesia symptoms: difficulty getting out of a chair (pushes off) Loss of smell:  No. Loss of taste:  No. Urinary Incontinence:  Yes.  , wears pad Difficulty Swallowing:  No. per pt but wife states that he will have trouble with peanuts and things with crunchy texture Depression:  No. Memory changes:  No. Hallucinations:  No.  visual distortions: No. N/V:  No. Lightheaded:  No.  Syncope: No. Diplopia:  No.   Neuroimaging of the brain has not previously been performed.     ALLERGIES:  No Known Allergies  CURRENT MEDICATIONS:  Current Outpatient Medications  Medication Instructions  . aspirin 81 MG EC tablet 1 tablet, Oral, Daily  . atorvastatin (LIPITOR) 40 MG tablet 1 tablet, Oral, Daily    Objective:   VITALS:   Vitals:   12/05/20 0945  BP: 134/86  Pulse: 65  SpO2: 95%  Weight: 226 lb (102.5 kg)  Height: 5\' 11"  (1.803 m)    GEN:  The patient appears stated age and is in NAD. HEENT:  Normocephalic, atraumatic.  The mucous membranes are moist. The superficial temporal arteries are without ropiness or tenderness. CV:  RRR Lungs:  CTAB Neck/HEME:  There are no carotid bruits bilaterally.  Neurological examination:  Orientation: The patient is alert and oriented x3.  Cranial nerves: There is good facial symmetry. Extraocular muscles are intact. The visual fields are full to confrontational testing. The speech is fluent and slightly pseudobulbar. Soft palate rises symmetrically and there is no tongue deviation. Hearing is intact to conversational tone. Sensation: Sensation is intact to light and pinprick throughout (facial, trunk, extremities). Vibration is intact at the bilateral big toe. There is no extinction with double simultaneous stimulation.  There is no sensory dermatomal level identified. Motor: Strength is 5/5 in the bilateral upper and lower extremities.   Shoulder shrug is equal and symmetric.  There is no pronator drift. Deep tendon reflexes: Deep tendon reflexes are 2-/4 at the bilateral biceps, triceps, brachioradialis, patella and achilles. Plantar responses are downgoing bilaterally.  Movement examination: Tone: There is normal tone in the bilateral upper extremities.  The tone in the lower extremities is normal.  Abnormal movements: The patient has choreiform movements in the arms and legs and in the face as well.  It is overall fairly mild.  It is increased with distraction. Coordination:  There is no decremation with RAM's, with any form of RAMS, including alternating supination and pronation of the forearm, hand opening and closing, finger taps, heel taps and toe taps. Gait and Station: The patient has no difficulty arising out of a deep-seated chair without the use of the hands. The patient's stride length is good.   I have reviewed and interpreted the following labs independently Labs from primary care reviewed from 2020.Marland Kitchen  Sodium was 142, potassium 4.3, chloride 106, CO2 29, BUN 17, creatinine 0.85, glucose 72.  He had lab work at [facility] in May, 2021.  Sodium was 138, potassium 4.6, chloride 106, CO2 22, BUN 19, creatinine 0.79 Total time spent on today's visit was 60 minutes, including both face-to-face time and nonface-to-face time.  Time included that spent on review of records (prior notes available to me/labs/imaging if pertinent), discussing treatment and goals, answering patient's questions and coordinating care.  Cc:  Kristen Loader, FNP

## 2020-12-05 ENCOUNTER — Encounter: Payer: Self-pay | Admitting: Neurology

## 2020-12-05 ENCOUNTER — Other Ambulatory Visit: Payer: Self-pay

## 2020-12-05 ENCOUNTER — Ambulatory Visit: Payer: Medicare Other | Admitting: Neurology

## 2020-12-05 VITALS — BP 134/86 | HR 65 | Ht 71.0 in | Wt 226.0 lb

## 2020-12-05 DIAGNOSIS — G255 Other chorea: Secondary | ICD-10-CM

## 2020-12-10 DIAGNOSIS — E782 Mixed hyperlipidemia: Secondary | ICD-10-CM | POA: Diagnosis not present

## 2020-12-10 DIAGNOSIS — F17200 Nicotine dependence, unspecified, uncomplicated: Secondary | ICD-10-CM | POA: Diagnosis not present

## 2020-12-10 DIAGNOSIS — Z131 Encounter for screening for diabetes mellitus: Secondary | ICD-10-CM | POA: Diagnosis not present

## 2020-12-10 DIAGNOSIS — Z1211 Encounter for screening for malignant neoplasm of colon: Secondary | ICD-10-CM | POA: Diagnosis not present

## 2020-12-10 DIAGNOSIS — Z23 Encounter for immunization: Secondary | ICD-10-CM | POA: Diagnosis not present

## 2020-12-10 DIAGNOSIS — G259 Extrapyramidal and movement disorder, unspecified: Secondary | ICD-10-CM | POA: Diagnosis not present

## 2020-12-10 DIAGNOSIS — Z0000 Encounter for general adult medical examination without abnormal findings: Secondary | ICD-10-CM | POA: Diagnosis not present

## 2020-12-19 ENCOUNTER — Telehealth: Payer: Self-pay | Admitting: Neurology

## 2020-12-19 DIAGNOSIS — G255 Other chorea: Secondary | ICD-10-CM | POA: Diagnosis not present

## 2020-12-19 NOTE — Telephone Encounter (Signed)
Patient called in wanting to make Dr. Carles Collet aware he just got his blood test at [facility].

## 2020-12-20 DIAGNOSIS — G4733 Obstructive sleep apnea (adult) (pediatric): Secondary | ICD-10-CM | POA: Diagnosis not present

## 2021-02-11 ENCOUNTER — Telehealth: Payer: Self-pay | Admitting: Neurology

## 2021-02-11 NOTE — Telephone Encounter (Signed)
Patient called in and left a message wanting to get some results

## 2021-02-11 NOTE — Telephone Encounter (Signed)
I presume he is calling for [place] testing and I didn't get any back on him.  Call [facility] and inquire please

## 2021-02-13 NOTE — Telephone Encounter (Signed)
Let pt know that testing was negative for Huntingtons disease.  If he would like to pursue further testing (he didn't previously and declined MRI), have him make f/u and we can discuss

## 2021-02-13 NOTE — Telephone Encounter (Signed)
LVM--to call the office back regarding results.

## 2021-02-13 NOTE — Telephone Encounter (Signed)
Called [facility] [phone] will re-sent/fax the results. Verified fax  to [phone].

## 2021-02-14 NOTE — Telephone Encounter (Signed)
Notified pt wife --regarding the testing results. Pt wife voiced understanding and will talk to pt regarding further testing.

## 2021-02-21 DIAGNOSIS — K635 Polyp of colon: Secondary | ICD-10-CM | POA: Diagnosis not present

## 2021-02-25 IMAGING — DX DG KNEE COMPLETE 4+V*R*
4 series · 4 of 4 positions shown · non-contrast
Comparison: Radiograph 08/31/2018

CLINICAL DATA: Right knee pain, onset today, posteriorly rating
both up and down leg. No known injury.

EXAM:
RIGHT KNEE - COMPLETE 4+ VIEW

[knee ap]
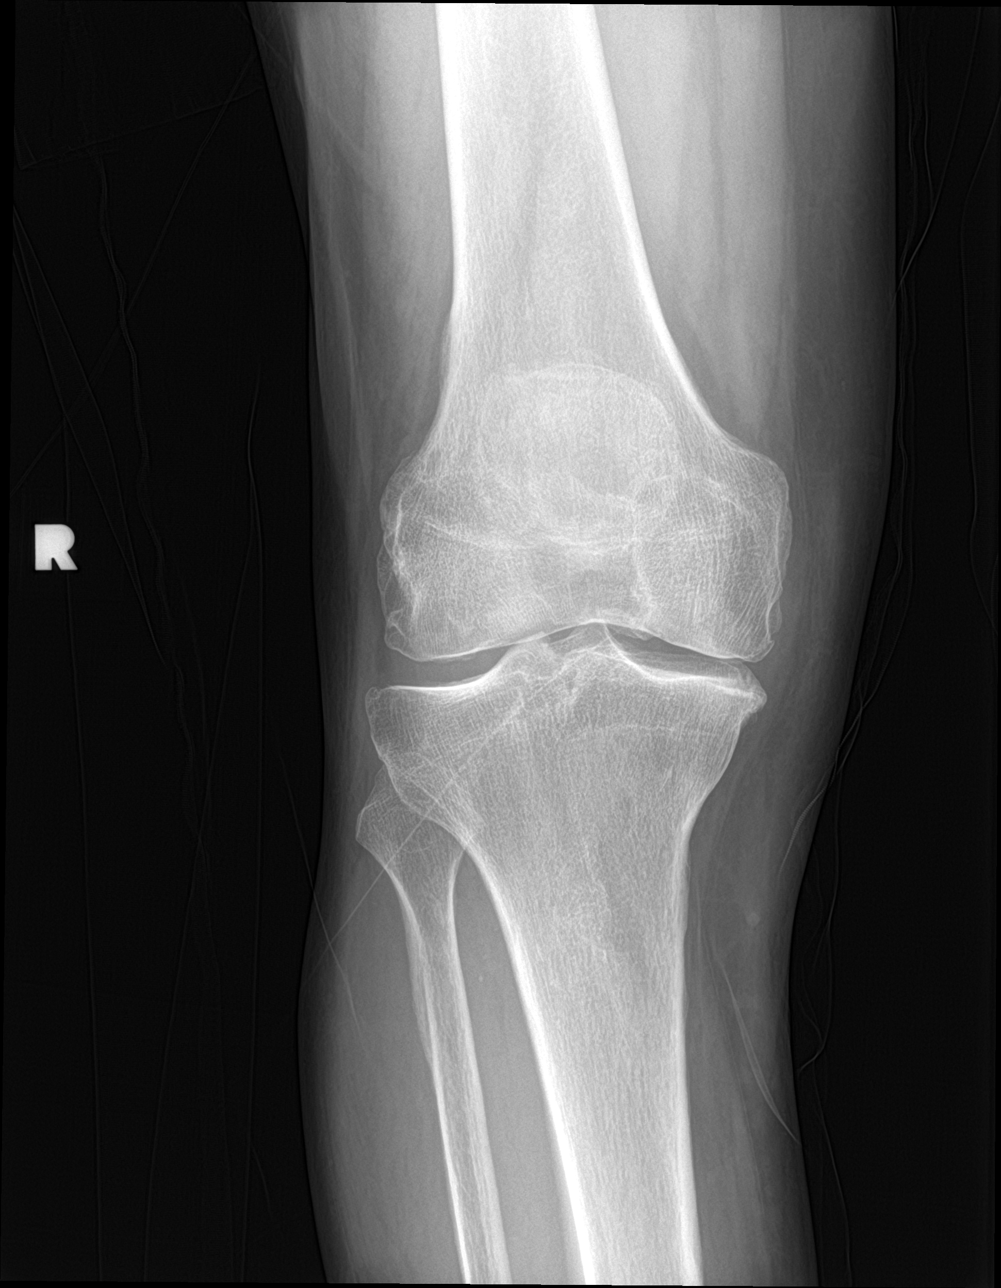

[knee lat]
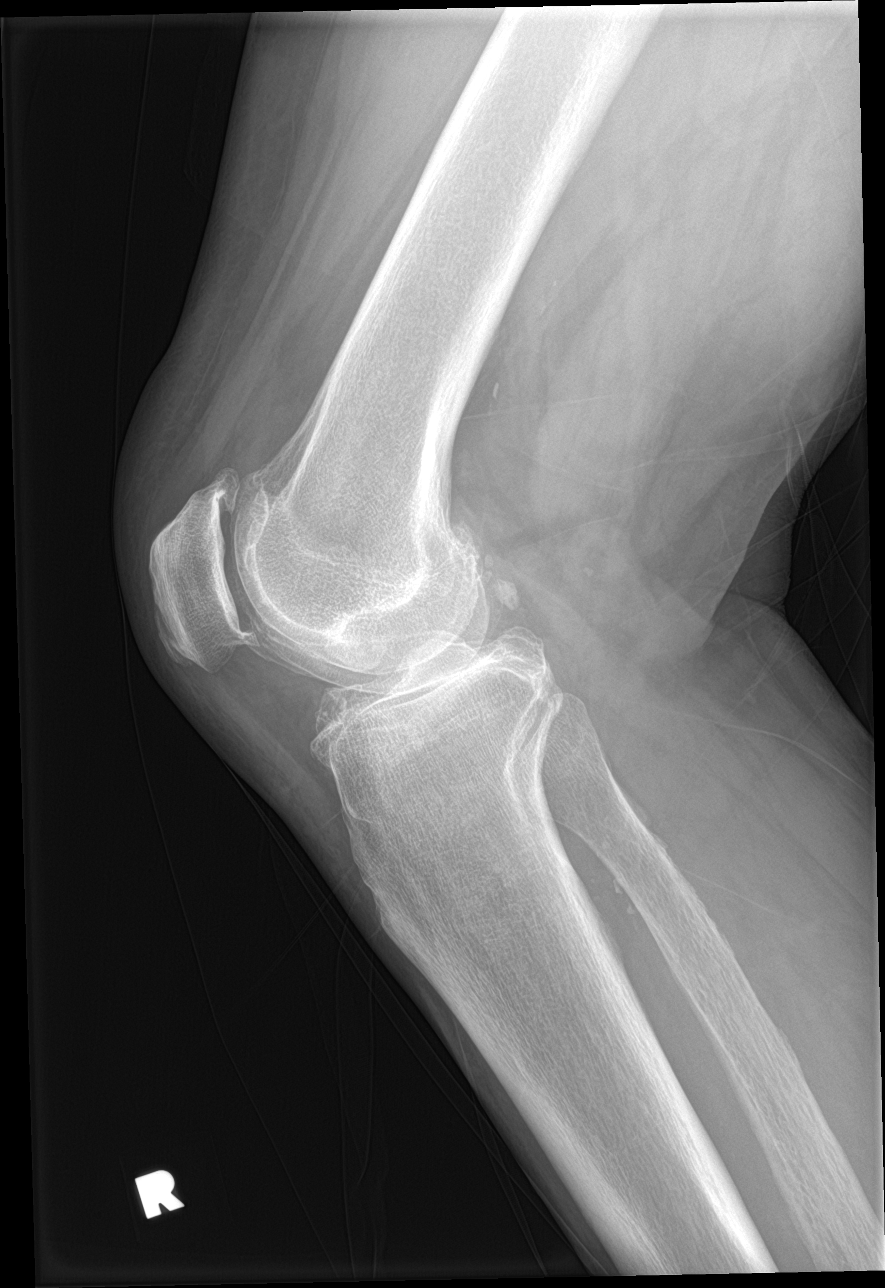

[knee obl (1 of 2)]
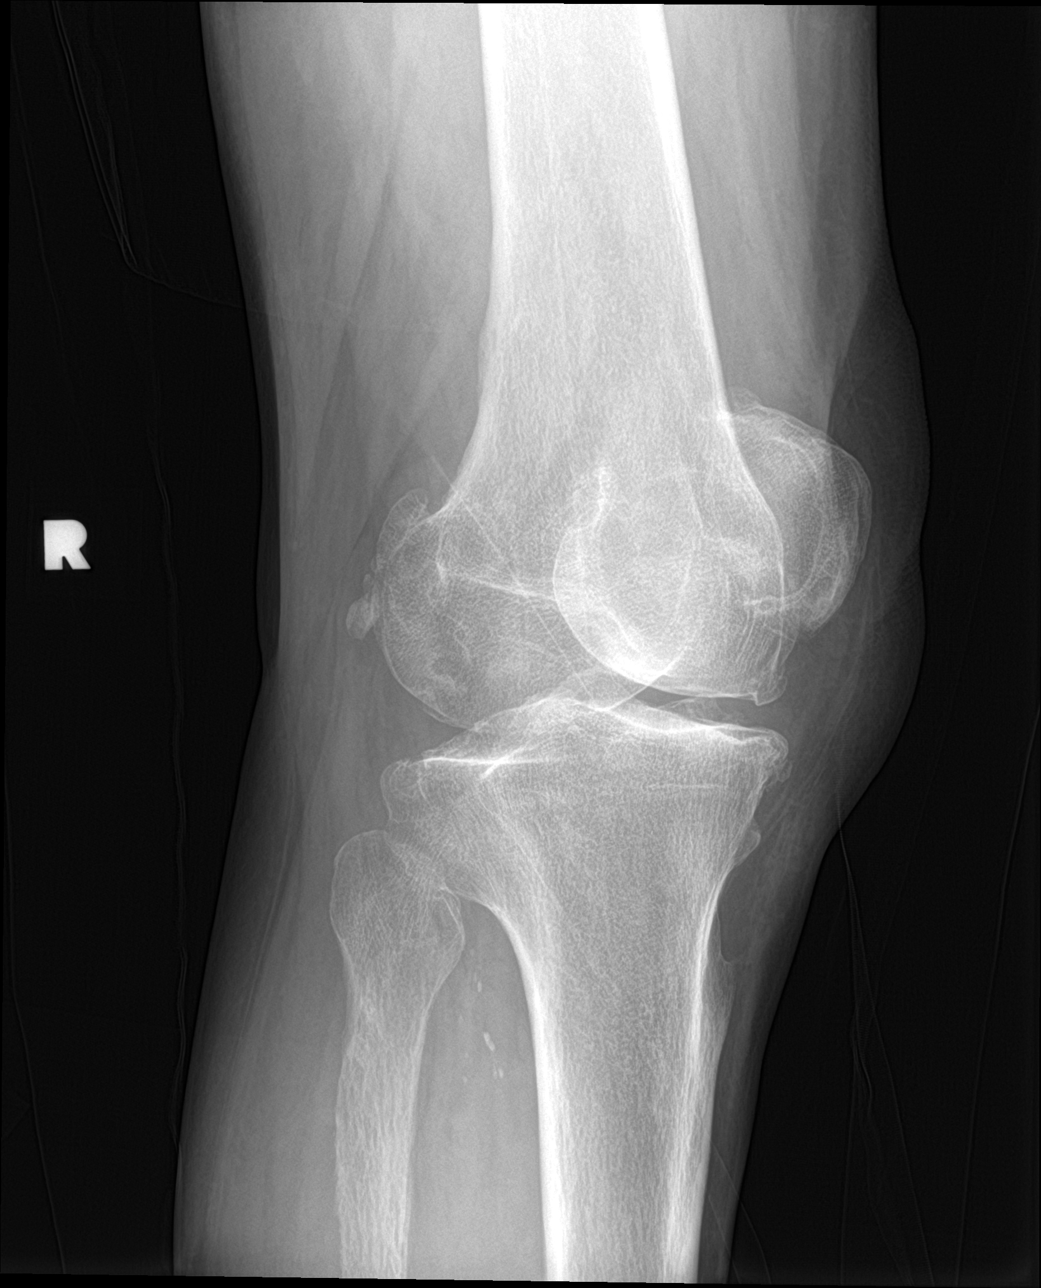

[knee obl (2 of 2)]
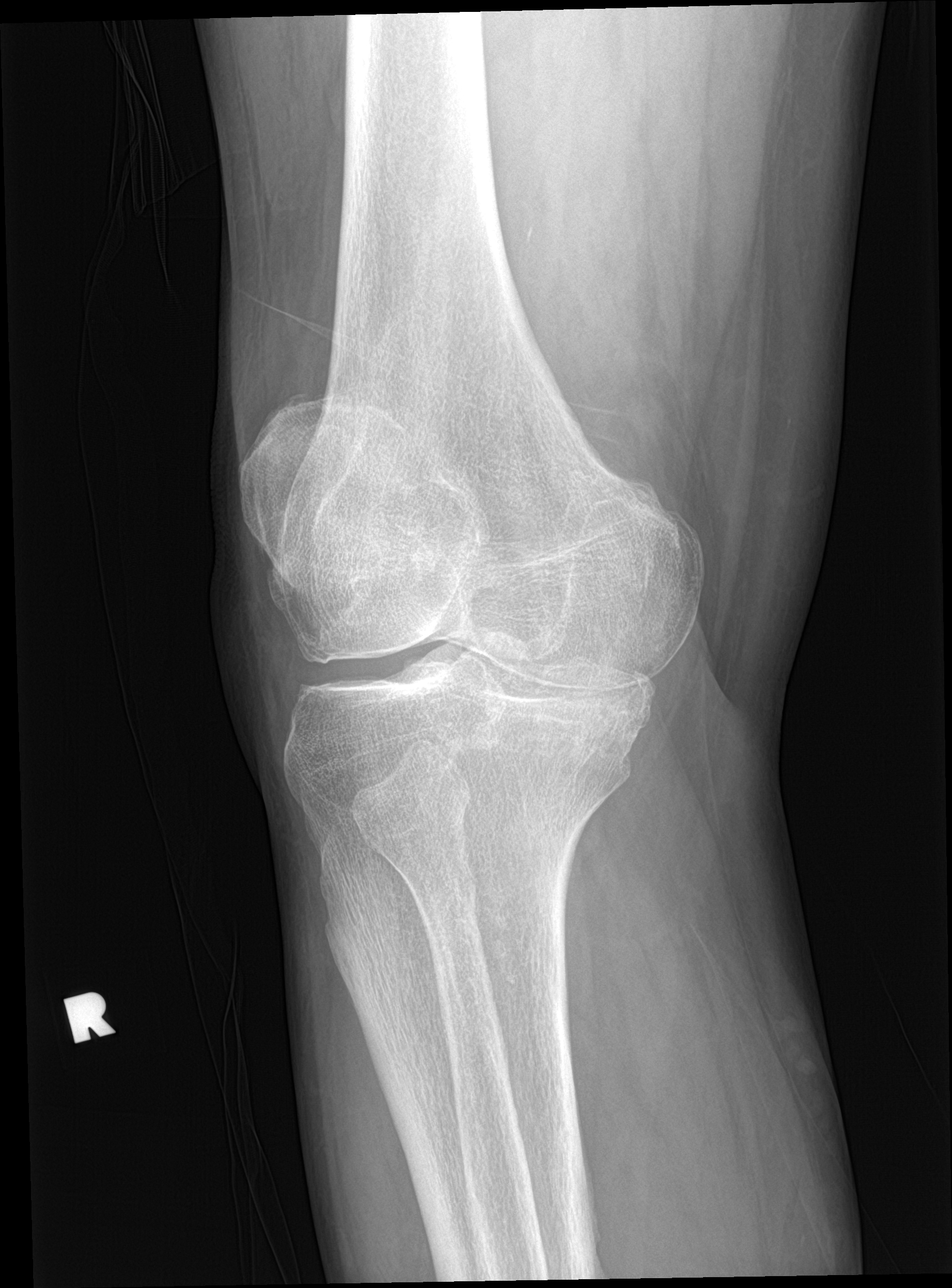

[4 of 4 positions shown; findings below may reference images not displayed]

FINDINGS: Tricompartmental osteoarthritis with peripheral spurring. Medial
tibiofemoral joint space narrowing. Degenerative changes are not
significantly changed from 9139 exam. No erosion, periosteal
reaction, or bony destruction. No fracture. Small knee joint
effusion, less prominent than on prior. Small patellar tendon
enthesophyte.
IMPRESSION: Unchanged tricompartmental osteoarthritis compared to 9139 exam.
Small knee joint effusion, less prominent than on prior.

No acute findings.

## 2021-03-21 DIAGNOSIS — K5730 Diverticulosis of large intestine without perforation or abscess without bleeding: Secondary | ICD-10-CM | POA: Diagnosis not present

## 2021-03-21 DIAGNOSIS — Z86010 Personal history of colonic polyps: Secondary | ICD-10-CM | POA: Diagnosis not present

## 2021-03-21 DIAGNOSIS — D122 Benign neoplasm of ascending colon: Secondary | ICD-10-CM | POA: Diagnosis not present

## 2021-03-25 DIAGNOSIS — D122 Benign neoplasm of ascending colon: Secondary | ICD-10-CM | POA: Diagnosis not present

## 2021-05-07 DIAGNOSIS — R3915 Urgency of urination: Secondary | ICD-10-CM | POA: Diagnosis not present

## 2021-05-13 DIAGNOSIS — K5790 Diverticulosis of intestine, part unspecified, without perforation or abscess without bleeding: Secondary | ICD-10-CM | POA: Diagnosis not present

## 2021-05-13 DIAGNOSIS — K625 Hemorrhage of anus and rectum: Secondary | ICD-10-CM | POA: Diagnosis not present

## 2021-05-13 DIAGNOSIS — K635 Polyp of colon: Secondary | ICD-10-CM | POA: Diagnosis not present

## 2021-05-15 DIAGNOSIS — K5790 Diverticulosis of intestine, part unspecified, without perforation or abscess without bleeding: Secondary | ICD-10-CM | POA: Diagnosis not present

## 2021-05-15 DIAGNOSIS — K921 Melena: Secondary | ICD-10-CM | POA: Diagnosis not present

## 2021-06-12 DIAGNOSIS — G4733 Obstructive sleep apnea (adult) (pediatric): Secondary | ICD-10-CM | POA: Diagnosis not present

## 2021-07-09 DIAGNOSIS — H905 Unspecified sensorineural hearing loss: Secondary | ICD-10-CM | POA: Diagnosis not present

## 2021-08-07 DIAGNOSIS — I739 Peripheral vascular disease, unspecified: Secondary | ICD-10-CM | POA: Diagnosis not present

## 2021-08-07 DIAGNOSIS — R0989 Other specified symptoms and signs involving the circulatory and respiratory systems: Secondary | ICD-10-CM | POA: Diagnosis not present

## 2021-10-23 DIAGNOSIS — G4733 Obstructive sleep apnea (adult) (pediatric): Secondary | ICD-10-CM | POA: Diagnosis not present

## 2021-11-13 DIAGNOSIS — I739 Peripheral vascular disease, unspecified: Secondary | ICD-10-CM | POA: Diagnosis not present

## 2021-12-16 DIAGNOSIS — R0602 Shortness of breath: Secondary | ICD-10-CM | POA: Diagnosis not present

## 2021-12-16 DIAGNOSIS — E782 Mixed hyperlipidemia: Secondary | ICD-10-CM | POA: Diagnosis not present

## 2021-12-16 DIAGNOSIS — M25552 Pain in left hip: Secondary | ICD-10-CM | POA: Diagnosis not present

## 2021-12-16 DIAGNOSIS — Z0000 Encounter for general adult medical examination without abnormal findings: Secondary | ICD-10-CM | POA: Diagnosis not present

## 2021-12-16 DIAGNOSIS — Z131 Encounter for screening for diabetes mellitus: Secondary | ICD-10-CM | POA: Diagnosis not present

## 2021-12-16 DIAGNOSIS — M1990 Unspecified osteoarthritis, unspecified site: Secondary | ICD-10-CM | POA: Diagnosis not present

## 2022-04-14 ENCOUNTER — Other Ambulatory Visit: Payer: Self-pay | Admitting: Family Medicine

## 2022-04-14 ENCOUNTER — Ambulatory Visit
Admission: RE | Admit: 2022-04-14 | Discharge: 2022-04-14 | Disposition: A | Discharge status: Home / Self Care | Payer: Medicare Other | Source: Ambulatory Visit | Attending: Family Medicine | Admitting: Family Medicine

## 2022-04-14 DIAGNOSIS — M25552 Pain in left hip: Secondary | ICD-10-CM

## 2022-04-14 DIAGNOSIS — E782 Mixed hyperlipidemia: Secondary | ICD-10-CM | POA: Diagnosis not present

## 2022-05-12 DIAGNOSIS — M5432 Sciatica, left side: Secondary | ICD-10-CM | POA: Diagnosis not present

## 2022-05-12 DIAGNOSIS — M1612 Unilateral primary osteoarthritis, left hip: Secondary | ICD-10-CM | POA: Diagnosis not present

## 2022-05-18 ENCOUNTER — Other Ambulatory Visit: Payer: Self-pay

## 2022-05-18 ENCOUNTER — Ambulatory Visit: Payer: Medicare Other | Attending: Student | Admitting: Rehabilitative and Restorative Service Providers"

## 2022-05-18 ENCOUNTER — Encounter: Payer: Self-pay | Admitting: Rehabilitative and Restorative Service Providers"

## 2022-05-18 DIAGNOSIS — M5432 Sciatica, left side: Secondary | ICD-10-CM | POA: Insufficient documentation

## 2022-05-18 DIAGNOSIS — M25552 Pain in left hip: Secondary | ICD-10-CM

## 2022-05-18 DIAGNOSIS — M6281 Muscle weakness (generalized): Secondary | ICD-10-CM

## 2022-05-18 NOTE — Therapy (Signed)
OUTPATIENT PHYSICAL THERAPY EVALUATION   Patient Name: Sean Mullen MRN: [id number] DOB:10-May-1946, 76 y.o., male Today's Date: 05/18/2022   PT End of Session - 05/18/22 1158     Visit Number 1    Number of Visits 12    Date for PT Re-Evaluation 06/29/22    Authorization Type [employer] medicare    Progress Note Due on Visit 10    PT Start Time 9242    PT Stop Time 1236    PT Time Calculation (min) 47 min             Past Medical History:  Diagnosis Date   Hyperlipidemia    Past Surgical History:  Procedure Laterality Date   HIP SURGERY     TONSILLECTOMY     Patient Active Problem List   Diagnosis Date Noted   EPIDIDYMITIS, RIGHT 09/25/2010   OPEN WOUND KNEE LEG&ANK WITHOUT MENTION COMP 03/26/2010    PCP: Lawerance Cruel, MD  REFERRING PROVIDER: Larene Pickett, PA-C  REFERRING DIAG: sciatica  Rationale for Evaluation and Treatment Rehabilitation  THERAPY DIAG:  Muscle weakness (generalized)  Pain in left hip  ONSET DATE: chronic, worsening symptoms recently August 2023.  SUBJECTIVE:                                                                                                                                                                                           SUBJECTIVE STATEMENT: The patient reports a h/o L side dislocated hip in childhood.  He began with pain in the L hip 7-8 years ago.  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    PAIN:  Are you having pain? Yes: NPRS scale: 0/10 Pain location: L glut region Pain description: none current Aggravating factors: walking for longer distances Relieving factors: tylenol Pain radiates into the L leg *Pain feels like a nail at times when flared up   PRECAUTIONS: None  WEIGHT BEARING RESTRICTIONS No  FALLS:  Has patient fallen in last 6 months? 1 fall last year in the yard  LIVING ENVIRONMENT: Lives with: spouse Lives in: House/apartment Stairs: Yes: Internal:  to basement steps; can reach both Has following equipment at home: None  OCCUPATION: retired  PLOF: Independent  PATIENT GOALS Reduce pain in L gluteal region, walk without pain   OBJECTIVE:   DIAGNOSTIC FINDINGS:  Had x-rays and scans in the past No acute fracture or dislocation. Degenerative joint changes of left hip.   PATIENT SURVEYS:  FOTO 70%   Goal is to maintain current level  SENSATION: WFL  MUSCLE LENGTH: Hamstrings: Right -30 deg; Left -30  deg Hip flexor tightness noted in standing with significant trunk flexion + knee flexion  POSTURE: flexed trunk ; antalgic appearing gait.  Sits with trunk shifted to the R side consistently during eval  PALPATION: Tender only with deep pressure to lateral SI border and piriformis    AROM:    The patient has WFLs for AROM. Tightness is noted leading to flexed hips and knees and a flattened lumbar appearance in standing.  General stiffness noted with movement in lumbar spine.  LOWER EXTREMITY MMT:    MMT Right eval Left eval  Hip flexion 5/5 5/5  Hip extension    Hip abduction    Hip adduction    Hip internal rotation    Hip external rotation    Knee flexion 5/5 5/5  Knee extension 5/5 5/5  Ankle dorsiflexion 5/5 5/5  Ankle plantarflexion 5/5 5/5  Ankle inversion    Ankle eversion     (Blank rows = not tested)  SPECIAL TESTS:  Single knee to chest no pain, hip scour no pain R and L  FUNCTIONAL TESTS:  5 times sit to stand: 16.47 seconds without UEs Single leg stance-- intermittent UE support  GAIT: Distance walked: 100 Assistive device utilized: None Level of assistance: Complete Independence Comments: mild note of L ankle dec'd eccentric control anterior tibialis  TODAY'S TREATMENT  Seated piriformis and HS stretch, standing gastroc stretch and single leg stance  PATIENT EDUCATION:  Education details: HEP, plan of care Person educated: Patient Education method: Explanation, Demonstration, Verbal  cues, and Handouts Education comprehension: verbalized understanding, returned demonstration, and needs further education   HOME EXERCISE PROGRAM: Access Code: [email or web address] URL: [email or web address] Date: 05/18/2022 Prepared by: Rudell Cobb  Exercises - Seated Hamstring Stretch  - 2 x daily - 7 x weekly - 1 sets - 2 reps - 30 seconds hold - Seated Figure 4 Piriformis Stretch  - 2 x daily - 7 x weekly - 1 sets - 2 reps - 30 seconds hold - Single Leg Stance with Support  - 2 x daily - 7 x weekly - 1 sets - 3 reps - 10 seconds hold  ASSESSMENT:  CLINICAL IMPRESSION: Patient is a 76 y.o. male who was seen today for physical therapy evaluation and treatment for increasing pain in the L gluteal region that has worsened int he past year.  He notes a h/o L hip pathology from childhood and arthritic pain x the past 7 years.  Pain worsens with standing long periods or walking.  During today's evaluation, pain was not produced except for during L Single leg stance and with deep palpation.  PT to address deficits to promote improved functional status.  Provided education about sitting for too long during the day and slowly increasing activity.  OBJECTIVE IMPAIRMENTS Abnormal gait, decreased activity tolerance, decreased balance, hypomobility, increased fascial restrictions, impaired flexibility, postural dysfunction, and pain.   ACTIVITY LIMITATIONS sitting, standing, stairs, and locomotion level  PARTICIPATION LIMITATIONS: driving and community activity  PERSONAL FACTORS Age and Time since onset of injury/illness/exacerbation are also affecting patient's functional outcome.   REHAB POTENTIAL: Good  CLINICAL DECISION MAKING: Stable/uncomplicated  EVALUATION COMPLEXITY: Low   GOALS: Goals reviewed with patient? Yes  LONG TERM GOALS: Target date: 06/29/2022  The patient will be indep with HEP progression. Baseline: No current HEP Goal status: INITIAL  2.  The patient will  maintain FOTO score of 70%. Baseline: 70%, risk adjusted score to 47%. Goal status: INITIAL  3.  The patient will demonstrate  improved standing posture for ADLs and gait. Baseline:  flexed trunk positioning Goal status: INITIAL  4.  The patient will report reduction in frequency and intensity of L glut pain to < or equal to 2 days/week with household activities. Baseline:  depends on activities. Goal status: INITIAL  5.  The patient will demo single leg stance x 10 seconds bilat. Baseline: needs UE intermittent support Goal status: INITIAL  PLAN: PT FREQUENCY: 2x/week  PT DURATION: 6 weeks  PLANNED INTERVENTIONS: Therapeutic exercises, Therapeutic activity, Neuromuscular re-education, Balance training, Gait training, Patient/Family education, Self Care, Joint mobilization, Dry Needling, Electrical stimulation, Cryotherapy, Moist heat, Taping, Traction, and Manual therapy.  PLAN FOR NEXT SESSION:  posture, ROM In lumbar spine, hip flexor stretching, treadmill   Lenita Peregrina, PT 05/18/2022, 1:09 PM'

## 2022-05-22 ENCOUNTER — Ambulatory Visit: Payer: Medicare Other | Admitting: Rehabilitative and Restorative Service Providers"

## 2022-05-22 ENCOUNTER — Encounter: Payer: Self-pay | Admitting: Rehabilitative and Restorative Service Providers"

## 2022-05-22 DIAGNOSIS — M5432 Sciatica, left side: Secondary | ICD-10-CM | POA: Diagnosis not present

## 2022-05-22 DIAGNOSIS — M25552 Pain in left hip: Secondary | ICD-10-CM

## 2022-05-22 DIAGNOSIS — M6281 Muscle weakness (generalized): Secondary | ICD-10-CM

## 2022-05-22 NOTE — Therapy (Signed)
OUTPATIENT PHYSICAL THERAPY TREATMENT   Patient Name: Sean Mullen MRN: [id number] DOB:[birth year], 76 y.o., male Today's Date: 05/22/2022   PT End of Session - 05/22/22 0758     Visit Number 2    Number of Visits 12    Date for PT Re-Evaluation 06/29/22    Authorization Type [employer] medicare    Progress Note Due on Visit 10    PT Start Time 0800    PT Stop Time 0843    PT Time Calculation (min) 43 min    Activity Tolerance Patient tolerated treatment well    Behavior During Therapy [facility] for tasks assessed/performed             Past Medical History:  Diagnosis Date   Hyperlipidemia    Past Surgical History:  Procedure Laterality Date   HIP SURGERY     TONSILLECTOMY     Patient Active Problem List   Diagnosis Date Noted   EPIDIDYMITIS, RIGHT 09/25/2010   OPEN WOUND KNEE LEG&ANK WITHOUT MENTION COMP 03/26/2010    PCP: Lawerance Cruel, MD  REFERRING PROVIDER: Larene Pickett, PA-C  REFERRING DIAG: sciatica  Rationale for Evaluation and Treatment Rehabilitation  THERAPY DIAG:  Muscle weakness (generalized)  Pain in left hip  ONSET DATE: chronic, worsening symptoms recently August 2023.  SUBJECTIVE:                                                                                                                                                                                           SUBJECTIVE STATEMENT: The patient reports no pain this morning-- he notes pain is worse later in the day. The piriformis stretch reduces pain.  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    PAIN:  Are you having pain? Yes: NPRS scale: 0/10 Pain location: L glut region Pain description: none current Aggravating factors: walking for longer distances Relieving factors: tylenol , stretch Pain radiates into the L leg *Pain feels like a nail at times when flared up   PRECAUTIONS: None  WEIGHT BEARING RESTRICTIONS No   PATIENT GOALS Reduce pain in  L gluteal region, walk without pain   OBJECTIVE:   TODAY'S TREATMENT  05/22/22: Treadmill x up to 1.0 mph with bilat UE support and CGA Standing mini squat x 10 reps with bilat UE support Standing heel raises x 20 reps Backwards walking x 5 feet x 8 reps working on posterior chain activation Step ups to 6" step R and L sides x 10 reps Alternating foot taps to 6" surface dec'ing UE support with CGA for safety Seated piriformis and HS stretch  Supine bridges with feet on physioball x 10 reps x 3-5 second holds Knee to chest rolling ball with bilat LEs for core activation x 10 reps Supine trunk rotation with passive overpressure Sidelying hip abduction x 12 reps with tactile cues for technique Sit<>stand x 10 reps   PATIENT EDUCATION:  Education details: HEP, plan of care Person educated: Patient Education method: Explanation, Demonstration, Verbal cues, and Handouts Education comprehension: verbalized understanding, returned demonstration, and needs further education   HOME EXERCISE PROGRAM: Access Code: [email or web address] URL: [email or web address] Date: 05/22/2022 Prepared by: Rudell Cobb  Exercises - Sit to Stand Without Arm Support  - 2 x daily - 7 x weekly - 1 sets - 10 reps - Seated Hamstring Stretch  - 2 x daily - 7 x weekly - 1 sets - 2 reps - 30 seconds hold - Seated Figure 4 Piriformis Stretch  - 2 x daily - 7 x weekly - 1 sets - 2 reps - 30 seconds hold - Single Leg Stance with Support  - 2 x daily - 7 x weekly - 1 sets - 3 reps - 10 seconds hold - Backward Walking with Counter Support  - 2 x daily - 7 x weekly - 1 sets - 10 reps - Sidelying Hip Abduction  - 2 x daily - 7 x weekly - 1 sets - 10 reps  ASSESSMENT:  CLINICAL IMPRESSION: The patient tolerated there ex well and PT emphasized strengthening for hips to reduce pain.  Plan to continue to progress to tolerance.  Patient already feels some relief with HEP and is motivated to participate.  OBJECTIVE  IMPAIRMENTS Abnormal gait, decreased activity tolerance, decreased balance, hypomobility, increased fascial restrictions, impaired flexibility, postural dysfunction, and pain.   ACTIVITY LIMITATIONS sitting, standing, stairs, and locomotion level  PARTICIPATION LIMITATIONS: driving and community activity  PERSONAL FACTORS Age and Time since onset of injury/illness/exacerbation are also affecting patient's functional outcome.   REHAB POTENTIAL: Good  CLINICAL DECISION MAKING: Stable/uncomplicated  EVALUATION COMPLEXITY: Low   GOALS: Goals reviewed with patient? Yes  LONG TERM GOALS: Target date: 06/29/2022  The patient will be indep with HEP progression. Baseline: No current HEP Goal status: INITIAL  2.  The patient will maintain FOTO score of 70%. Baseline: 70%, risk adjusted score to 47%. Goal status: INITIAL  3.  The patient will demonstrate improved standing posture for ADLs and gait. Baseline:  flexed trunk positioning Goal status: INITIAL  4.  The patient will report reduction in frequency and intensity of L glut pain to < or equal to 2 days/week with household activities. Baseline:  depends on activities. Goal status: INITIAL  5.  The patient will demo single leg stance x 10 seconds bilat. Baseline: needs UE intermittent support Goal status: INITIAL  PLAN: PT FREQUENCY: 2x/week  PT DURATION: 6 weeks  PLANNED INTERVENTIONS: Therapeutic exercises, Therapeutic activity, Neuromuscular re-education, Balance training, Gait training, Patient/Family education, Self Care, Joint mobilization, Dry Needling, Electrical stimulation, Cryotherapy, Moist heat, Taping, Traction, and Manual therapy.  PLAN FOR NEXT SESSION:   Progress there ex focusing on: posture, ROM In lumbar spine, hip flexor stretching, treadmill   Ranferi Clingan, PT 05/22/2022, 7:58 AM'

## 2022-05-25 ENCOUNTER — Ambulatory Visit: Payer: Medicare Other | Admitting: Rehabilitative and Restorative Service Providers"

## 2022-05-25 ENCOUNTER — Encounter: Payer: Self-pay | Admitting: Rehabilitative and Restorative Service Providers"

## 2022-05-25 DIAGNOSIS — M25552 Pain in left hip: Secondary | ICD-10-CM

## 2022-05-25 DIAGNOSIS — M6281 Muscle weakness (generalized): Secondary | ICD-10-CM

## 2022-05-25 DIAGNOSIS — M5432 Sciatica, left side: Secondary | ICD-10-CM | POA: Diagnosis not present

## 2022-05-25 NOTE — Therapy (Signed)
OUTPATIENT PHYSICAL THERAPY TREATMENT   Patient Name: Sean Mullen MRN: [id number] DOB:08-23-1946, 76 y.o., male Today's Date: 05/25/2022   PT End of Session - 05/25/22 1151     Visit Number 3    Number of Visits 12    Date for PT Re-Evaluation 06/29/22    Authorization Type [employer] medicare    Progress Note Due on Visit 10    PT Start Time 1151    PT Stop Time 1236    PT Time Calculation (min) 45 min    Activity Tolerance Patient tolerated treatment well    Behavior During Therapy WFL for tasks assessed/performed             Past Medical History:  Diagnosis Date   Hyperlipidemia    Past Surgical History:  Procedure Laterality Date   HIP SURGERY     TONSILLECTOMY     Patient Active Problem List   Diagnosis Date Noted   EPIDIDYMITIS, RIGHT 09/25/2010   OPEN WOUND KNEE LEG&ANK WITHOUT MENTION COMP 03/26/2010    PCP: Lawerance Cruel, MD  REFERRING PROVIDER: Larene Pickett, PA-C  REFERRING DIAG: sciatica  Rationale for Evaluation and Treatment Rehabilitation  THERAPY DIAG:  Muscle weakness (generalized)  Pain in left hip  ONSET DATE: chronic, worsening symptoms recently August 2023.  SUBJECTIVE:                                                                                                                                                                                           SUBJECTIVE STATEMENT: The patient reports he went up a lot of steps on Friday after PT (going to courthouse in [place]).  He came in today noting he was sore after that.  He is limping slightly upon entering PT but states no pain.  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    PAIN:  Are you having pain? Yes: NPRS scale: 0/10 Pain location: L glut region Pain description: none current Aggravating factors: walking for longer distances Relieving factors: tylenol , stretch Pain radiates into the L leg *Pain feels like a nail at times when flared  up   PRECAUTIONS: None  WEIGHT BEARING RESTRICTIONS No   PATIENT GOALS Reduce pain in L gluteal region, walk without pain   OBJECTIVE:  TODAY'S TREATMENT  05/25/22: Treadmill x up to 1.24mh with bilat UE support and CGA x 3 minutes Standing mini squat x 10 reps with bilat UE support Standing heel raises x 20 reps Standing hip abduction Step ups to 6" step R and L sides x 10 reps     Supine HS  stretch with strap and tactile cues + verbal cues for correct techniqueSeated piriformis and HS stretch Supine bridges x 10 reps x 3-5 second holds Supine trunk rotation with passive overpressure Sidelying hip abduction x 12 reps with tactile cues for technique Sit<>stand x 10 reps with ball reaching overhead Balance on foam with eyes open with wall bumps then eyes closed x 30 seconds with CGA for support near wall Standing single leg stance x 10 seconds with UE support Seated piriformis stretch x 30 seconds x 2 reps Hip flexor stretch eated   05/22/22: Treadmill x up to 1.0 mph with bilat UE support and CGA Standing mini squat x 10 reps with bilat UE support Standing heel raises x 20 reps Backwards walking x 5 feet x 8 reps working on posterior chain activation Step ups to 6" step R and L sides x 10 reps Alternating foot taps to 6" surface dec'ing UE support with CGA for safety Seated piriformis and HS stretch Supine bridges with feet on physioball x 10 reps x 3-5 second holds Knee to chest rolling ball with bilat LEs for core activation x 10 reps Supine trunk rotation with passive overpressure Sidelying hip abduction x 12 reps with tactile cues for technique Sit<>stand x 10 reps   PATIENT EDUCATION:  Education details: HEP, plan of care Person educated: Patient Education method: Explanation, Demonstration, Verbal cues, and Handouts Education comprehension: verbalized understanding, returned demonstration, and needs further education   HOME EXERCISE PROGRAM: Access Code:  [email or web address] URL: [email or web address] Date: 05/25/2022 Prepared by: Rudell Cobb  Exercises - Sit to Stand Without Arm Support  - 2 x daily - 7 x weekly - 1 sets - 10 reps - Seated Hamstring Stretch with Chair  - 2 x daily - 7 x weekly - 1 sets - 2 reps - 20-30 seconds hold - Seated Figure 4 Piriformis Stretch  - 2 x daily - 7 x weekly - 1 sets - 2 reps - 30 seconds hold - Single Leg Stance with Support  - 2 x daily - 7 x weekly - 1 sets - 3 reps - 10 seconds hold - Backward Walking with Counter Support  - 2 x daily - 7 x weekly - 1 sets - 10 reps - Sidelying Hip Abduction  - 2 x daily - 7 x weekly - 1 sets - 10 reps  ASSESSMENT:  CLINICAL IMPRESSION: The patient is continuing to need to take equate OTC meds for pain in the evening.  He does not notice pain until later in the day.  He is tolerating HEP well.  PT continuing to progress balance, strength, and stretching. Plan to continue to progress to tolerance.    OBJECTIVE IMPAIRMENTS Abnormal gait, decreased activity tolerance, decreased balance, hypomobility, increased fascial restrictions, impaired flexibility, postural dysfunction, and pain.   ACTIVITY LIMITATIONS sitting, standing, stairs, and locomotion level  PARTICIPATION LIMITATIONS: driving and community activity  PERSONAL FACTORS Age and Time since onset of injury/illness/exacerbation are also affecting patient's functional outcome.   REHAB POTENTIAL: Good  CLINICAL DECISION MAKING: Stable/uncomplicated  EVALUATION COMPLEXITY: Low   GOALS: Goals reviewed with patient? Yes  LONG TERM GOALS: Target date: 06/29/2022  The patient will be indep with HEP progression. Baseline: No current HEP Goal status: INITIAL  2.  The patient will maintain FOTO score of 70%. Baseline: 70%, risk adjusted score to 47%. Goal status: INITIAL  3.  The patient will demonstrate improved standing posture for ADLs and gait. Baseline:  flexed trunk positioning Goal  status: INITIAL  4.  The patient will report reduction in frequency and intensity of L glut pain to < or equal to 2 days/week with household activities. Baseline:  depends on activities. Goal status: INITIAL  5.  The patient will demo single leg stance x 10 seconds bilat. Baseline: needs UE intermittent support Goal status: INITIAL  PLAN: PT FREQUENCY: 2x/week  PT DURATION: 6 weeks  PLANNED INTERVENTIONS: Therapeutic exercises, Therapeutic activity, Neuromuscular re-education, Balance training, Gait training, Patient/Family education, Self Care, Joint mobilization, Dry Needling, Electrical stimulation, Cryotherapy, Moist heat, Taping, Traction, and Manual therapy.  PLAN FOR NEXT SESSION:   Check HEP progressing to tolerance. Progress there ex focusing on: posture, ROM In lumbar spine, hip flexor stretching, treadmill   Rebel Laughridge, PT 05/25/2022, 12:37 PM'

## 2022-05-27 ENCOUNTER — Ambulatory Visit: Payer: Medicare Other | Admitting: Rehabilitative and Restorative Service Providers"

## 2022-05-27 ENCOUNTER — Encounter: Payer: Self-pay | Admitting: Rehabilitative and Restorative Service Providers"

## 2022-05-27 DIAGNOSIS — M5432 Sciatica, left side: Secondary | ICD-10-CM | POA: Diagnosis not present

## 2022-05-27 DIAGNOSIS — M6281 Muscle weakness (generalized): Secondary | ICD-10-CM

## 2022-05-27 DIAGNOSIS — M25552 Pain in left hip: Secondary | ICD-10-CM

## 2022-05-27 NOTE — Therapy (Signed)
OUTPATIENT PHYSICAL THERAPY TREATMENT   Patient Name: Sean Mullen MRN: [id number] DOB:09-21-1946, 76 y.o., male Today's Date: 05/27/2022   PT End of Session - 05/27/22 1103     Visit Number 4    Number of Visits 12    Date for PT Re-Evaluation 06/29/22    Authorization Type [employer] medicare    Progress Note Due on Visit 10    PT Start Time 1104    PT Stop Time 1150    PT Time Calculation (min) 46 min    Activity Tolerance Patient tolerated treatment well    Behavior During Therapy WFL for tasks assessed/performed             Past Medical History:  Diagnosis Date   Hyperlipidemia    Past Surgical History:  Procedure Laterality Date   HIP SURGERY     TONSILLECTOMY     Patient Active Problem List   Diagnosis Date Noted   EPIDIDYMITIS, RIGHT 09/25/2010   OPEN WOUND KNEE LEG&ANK WITHOUT MENTION COMP 03/26/2010    PCP: Lawerance Cruel, MD  REFERRING PROVIDER: Larene Pickett, PA-C  REFERRING DIAG: sciatica  Rationale for Evaluation and Treatment Rehabilitation  THERAPY DIAG:  Muscle weakness (generalized)  Pain in left hip  ONSET DATE: chronic, worsening symptoms recently August 2023.  SUBJECTIVE:                                                                                                                                                                                           SUBJECTIVE STATEMENT: The patient notes no meds today and no pain.  He feels he is getting stronger.  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    PAIN:  Are you having pain? Yes: NPRS scale: 0/10 Pain location: L glut region Pain description: none current Aggravating factors: walking for longer distances Relieving factors: tylenol , stretch Pain radiates into the L leg *Pain feels like a nail at times when flared up   PRECAUTIONS: None  WEIGHT BEARING RESTRICTIONS No  PATIENT GOALS Reduce pain in L gluteal region, walk without  pain   OBJECTIVE:  TREATMENT     05/27/22: Treadmill up to 1.3 mph x 4 minutes without pain Standing foam eyes open and eyes closed and reaching overhead for postural stability Standing rocker board lateral tilting Standing heel raises unilateral stance with bilat UE support x 10 reps Standing mini squat with W arm scap retraction when rising x 10 reps Standing SLS x 5-10 seconds Standing hip adductor stretch x 3 reps R and L sides Seated HS stretch R and L x  30 seconds Standing heel cord stretch  Sit<>stand with overhead reaching x 10 reps Supine SLR x 10 reps R and L Supine trunk rotation Supine bridges x 10 reps Backwards walking x 20 feet x 4 reps Sidestepping with cues on upright posture Gait with emphasis on bilateral heel strike x 100 feet x 2 reps  05/25/22: Treadmill x up to 1.75mh with bilat UE support and CGA x 3 minutes Standing mini squat x 10 reps with bilat UE support Standing heel raises x 20 reps Standing hip abduction Step ups to 6" step R and L sides x 10 reps     Supine HS stretch with strap and tactile cues + verbal cues for correct techniqueSeated piriformis and HS stretch Supine bridges x 10 reps x 3-5 second holds Supine trunk rotation with passive overpressure Sidelying hip abduction x 12 reps with tactile cues for technique Sit<>stand x 10 reps with ball reaching overhead Balance on foam with eyes open with wall bumps then eyes closed x 30 seconds with CGA for support near wall Standing single leg stance x 10 seconds with UE support Seated piriformis stretch x 30 seconds x 2 reps Hip flexor stretch seated   05/22/22: Treadmill x up to 1.0 mph with bilat UE support and CGA Standing mini squat x 10 reps with bilat UE support Standing heel raises x 20 reps Backwards walking x 5 feet x 8 reps working on posterior chain activation Step ups to 6" step R and L sides x 10 reps Alternating foot taps to 6" surface dec'ing UE support with CGA for  safety Seated piriformis and HS stretch Supine bridges with feet on physioball x 10 reps x 3-5 second holds Knee to chest rolling ball with bilat LEs for core activation x 10 reps Supine trunk rotation with passive overpressure Sidelying hip abduction x 12 reps with tactile cues for technique Sit<>stand x 10 reps   PATIENT EDUCATION:  Education details: HEP, plan of care Person educated: Patient Education method: Explanation, Demonstration, Verbal cues, and Handouts Education comprehension: verbalized understanding, returned demonstration, and needs further education   HOME EXERCISE PROGRAM: Access Code: [email or web address]: [email or web address] Date: 05/25/2022 Prepared by: CRudell Cobb Exercises - Sit to Stand Without Arm Support  - 2 x daily - 7 x weekly - 1 sets - 10 reps - Seated Hamstring Stretch with Chair  - 2 x daily - 7 x weekly - 1 sets - 2 reps - 20-30 seconds hold - Seated Figure 4 Piriformis Stretch  - 2 x daily - 7 x weekly - 1 sets - 2 reps - 30 seconds hold - Single Leg Stance with Support  - 2 x daily - 7 x weekly - 1 sets - 3 reps - 10 seconds hold - Backward Walking with Counter Support  - 2 x daily - 7 x weekly - 1 sets - 10 reps - Sidelying Hip Abduction  - 2 x daily - 7 x weekly - 1 sets - 10 reps  ASSESSMENT:  CLINICAL IMPRESSION: The patient notes reduced pain with rehab and was able to go without meds x past 2 days.  He is progressing to LTGs showing progress with ther ex tolerance. Plan to continue working on transition to community exercise plan as we progress strength, balance, posture in the clinic.  OBJECTIVE IMPAIRMENTS Abnormal gait, decreased activity tolerance, decreased balance, hypomobility, increased fascial restrictions, impaired flexibility, postural dysfunction, and pain.   REHAB POTENTIAL: Good  GOALS: Goals reviewed with patient? Yes  LONG TERM GOALS: Target date: 06/29/2022  The patient will be indep with HEP  progression. Baseline: No current HEP Goal status: INITIAL  2.  The patient will maintain FOTO score of 70%. Baseline: 70%, risk adjusted score to 47%. Goal status: INITIAL  3.  The patient will demonstrate improved standing posture for ADLs and gait. Baseline:  flexed trunk positioning Goal status: INITIAL  4.  The patient will report reduction in frequency and intensity of L glut pain to < or equal to 2 days/week with household activities. Baseline:  depends on activities. Goal status: INITIAL  5.  The patient will demo single leg stance x 10 seconds bilat. Baseline: needs UE intermittent support Goal status: INITIAL  PLAN: PT FREQUENCY: 2x/week  PT DURATION: 6 weeks  PLANNED INTERVENTIONS: Therapeutic exercises, Therapeutic activity, Neuromuscular re-education, Balance training, Gait training, Patient/Family education, Self Care, Joint mobilization, Dry Needling, Electrical stimulation, Cryotherapy, Moist heat, Taping, Traction, and Manual therapy.  PLAN FOR NEXT SESSION:   Patient to trial silver sneakers, Check HEP progressing to tolerance. Progress there ex focusing on: posture, ROM In lumbar spine, hip flexor stretching, treadmill   Brook Geraci, PT 05/27/2022, 11:03 AM'

## 2022-06-01 ENCOUNTER — Ambulatory Visit: Payer: Medicare Other | Admitting: Rehabilitative and Restorative Service Providers"

## 2022-06-01 ENCOUNTER — Encounter: Payer: Self-pay | Admitting: Rehabilitative and Restorative Service Providers"

## 2022-06-01 DIAGNOSIS — M25552 Pain in left hip: Secondary | ICD-10-CM

## 2022-06-01 DIAGNOSIS — M6281 Muscle weakness (generalized): Secondary | ICD-10-CM

## 2022-06-01 DIAGNOSIS — M5432 Sciatica, left side: Secondary | ICD-10-CM | POA: Diagnosis not present

## 2022-06-01 NOTE — Therapy (Signed)
OUTPATIENT PHYSICAL THERAPY TREATMENT   Patient Name: Sean Mullen MRN: [id number] DOB:1946/01/21, 76 y.o., male Today's Date: 06/01/2022   PT End of Session - 06/01/22 1148     Visit Number 5    Number of Visits 12    Date for PT Re-Evaluation 06/29/22    Authorization Type [employer] medicare    Progress Note Due on Visit 10    PT Start Time 2482    PT Stop Time 1230    PT Time Calculation (min) 41 min    Activity Tolerance Patient tolerated treatment well    Behavior During Therapy WFL for tasks assessed/performed             Past Medical History:  Diagnosis Date   Hyperlipidemia    Past Surgical History:  Procedure Laterality Date   HIP SURGERY     TONSILLECTOMY     Patient Active Problem List   Diagnosis Date Noted   EPIDIDYMITIS, RIGHT 09/25/2010   OPEN WOUND KNEE LEG&ANK WITHOUT MENTION COMP 03/26/2010    PCP: Lawerance Cruel, MD  REFERRING PROVIDER: Larene Pickett, PA-C  REFERRING DIAG: sciatica  Rationale for Evaluation and Treatment Rehabilitation  THERAPY DIAG:  Muscle weakness (generalized)  Pain in left hip  ONSET DATE: chronic, worsening symptoms recently August 2023.  SUBJECTIVE:                                                                                                                                                                                           SUBJECTIVE STATEMENT: The patient went to the [facility] Friday and was tired and sore. He is not doing HEP regularly except stretch.  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    PAIN:  Are you having pain? Yes: NPRS scale: 0/10 Pain location: L glut region Pain description: none current Aggravating factors: walking for longer distances Relieving factors: tylenol , stretch Pain radiates into the L leg *Pain feels like a nail at times when flared up   PRECAUTIONS: None  WEIGHT BEARING RESTRICTIONS No  PATIENT GOALS Reduce pain in L gluteal  region, walk without pain   OBJECTIVE:  TREATMENT  06/01/22: Treadmill up to 1.3 mph x 4 minutes, without pain.  Standing Gastroc stretch R and L x 30 seconds x 2 reps Single leg standing near support surface x 5 seconds x 4 reps R and L Heel raises x 20 repetitions Backwards walking x 20 feet x 3 reps with SBA for safety Sidestepping x 8 reps x 4 reps  Supine Trunk rotation x 5 reps R and L Bridges x 10  reps SLR x 10 reps with 2 lb weights Piriformis stretch  Sidelying Hip abduction x 10 reps with 2 lb weights  Seated HS stretch Piriformis stretch     05/27/22: Treadmill up to 1.3 mph x 4 minutes without pain Standing foam eyes open and eyes closed and reaching overhead for postural stability Standing rocker board lateral tilting Standing heel raises unilateral stance with bilat UE support x 10 reps Standing mini squat with W arm scap retraction when rising x 10 reps Standing SLS x 5-10 seconds Standing hip adductor stretch x 3 reps R and L sides Seated HS stretch R and L x 30 seconds Standing heel cord stretch  Sit<>stand with overhead reaching x 10 reps Supine SLR x 10 reps R and L Supine trunk rotation Supine bridges x 10 reps Backwards walking x 20 feet x 4 reps Sidestepping with cues on upright posture Gait with emphasis on bilateral heel strike x 100 feet x 2 reps  05/25/22: Treadmill x up to 1.75mh with bilat UE support and CGA x 3 minutes Standing mini squat x 10 reps with bilat UE support Standing heel raises x 20 reps Standing hip abduction Step ups to 6" step R and L sides x 10 reps     Supine HS stretch with strap and tactile cues + verbal cues for correct techniqueSeated piriformis and HS stretch Supine bridges x 10 reps x 3-5 second holds Supine trunk rotation with passive overpressure Sidelying hip abduction x 12 reps with tactile cues for technique Sit<>stand x 10 reps with ball reaching overhead Balance on foam with eyes open with wall bumps  then eyes closed x 30 seconds with CGA for support near wall Standing single leg stance x 10 seconds with UE support Seated piriformis stretch x 30 seconds x 2 reps Hip flexor stretch seated   PATIENT EDUCATION:  Education details: HEP, plan of care Person educated: Patient Education method: Explanation, Demonstration, Verbal cues, and Handouts Education comprehension: verbalized understanding, returned demonstration, and needs further education   HOME EXERCISE PROGRAM: Access Code: [email or web address]: [email or web address] Date: 05/25/2022 Prepared by: CRudell Cobb Exercises - Sit to Stand Without Arm Support  - 2 x daily - 7 x weekly - 1 sets - 10 reps - Seated Hamstring Stretch with Chair  - 2 x daily - 7 x weekly - 1 sets - 2 reps - 20-30 seconds hold - Seated Figure 4 Piriformis Stretch  - 2 x daily - 7 x weekly - 1 sets - 2 reps - 30 seconds hold - Single Leg Stance with Support  - 2 x daily - 7 x weekly - 1 sets - 3 reps - 10 seconds hold - Backward Walking with Counter Support  - 2 x daily - 7 x weekly - 1 sets - 10 reps - Sidelying Hip Abduction  - 2 x daily - 7 x weekly - 1 sets - 10 reps  ASSESSMENT:  CLINICAL IMPRESSION: The patient reports no further radiating symptoms into the left leg.  [facility] acknowledging that he felt like he needed to keep up.  We plan to try again next week with instruction to rest, do less weight, etc until he gets used to the class.  Plan to continue working on transition to community exercise plan as we progress strength, balance, posture in the clinic.  OBJECTIVE IMPAIRMENTS Abnormal gait, decreased activity tolerance, decreased balance, hypomobility, increased fascial restrictions, impaired flexibility, postural dysfunction, and  pain.   REHAB POTENTIAL: Good  GOALS: Goals reviewed with patient? Yes  LONG TERM GOALS: Target date: 06/29/2022  The patient will be indep with HEP  progression. Baseline: No current HEP Goal status: INITIAL  2.  The patient will maintain FOTO score of 70%. Baseline: 70%, risk adjusted score to 47%. Goal status: INITIAL  3.  The patient will demonstrate improved standing posture for ADLs and gait. Baseline:  flexed trunk positioning Goal status: INITIAL  4.  The patient will report reduction in frequency and intensity of L glut pain to < or equal to 2 days/week with household activities. Baseline:  depends on activities. Goal status: INITIAL  5.  The patient will demo single leg stance x 10 seconds bilat. Baseline: needs UE intermittent support Goal status: INITIAL  PLAN: PT FREQUENCY: 2x/week  PT DURATION: 6 weeks  PLANNED INTERVENTIONS: Therapeutic exercises, Therapeutic activity, Neuromuscular re-education, Balance training, Gait training, Patient/Family education, Self Care, Joint mobilization, Dry Needling, Electrical stimulation, Cryotherapy, Moist heat, Taping, Traction, and Manual therapy.  PLAN FOR NEXT SESSION:   reduce frequency next week to 1x/week and begin to transition to community classes, Check HEP progressing to tolerance. Progress there ex focusing on: posture, ROM In lumbar spine, hip flexor stretching, treadmill   Donnia Poplaski, PT 06/01/2022, 11:48 AM'

## 2022-06-03 ENCOUNTER — Ambulatory Visit: Payer: Medicare Other | Admitting: Rehabilitative and Restorative Service Providers"

## 2022-06-03 ENCOUNTER — Encounter: Payer: Self-pay | Admitting: Rehabilitative and Restorative Service Providers"

## 2022-06-03 DIAGNOSIS — M25552 Pain in left hip: Secondary | ICD-10-CM

## 2022-06-03 DIAGNOSIS — M5432 Sciatica, left side: Secondary | ICD-10-CM | POA: Diagnosis not present

## 2022-06-03 DIAGNOSIS — M6281 Muscle weakness (generalized): Secondary | ICD-10-CM

## 2022-06-03 NOTE — Therapy (Signed)
OUTPATIENT PHYSICAL THERAPY TREATMENT   Patient Name: Sean Mullen MRN: [id number] DOB:09-Jan-1946, 76 y.o., male Today's Date: 06/03/2022   PT End of Session - 06/03/22 1145     Visit Number 6    Number of Visits 12    Date for PT Re-Evaluation 06/29/22    Authorization Type [employer] medicare    Progress Note Due on Visit 10    PT Start Time 1146    PT Stop Time 1230    PT Time Calculation (min) 44 min    Activity Tolerance Patient tolerated treatment well    Behavior During Therapy WFL for tasks assessed/performed              Past Medical History:  Diagnosis Date   Hyperlipidemia    Past Surgical History:  Procedure Laterality Date   HIP SURGERY     TONSILLECTOMY     Patient Active Problem List   Diagnosis Date Noted   EPIDIDYMITIS, RIGHT 09/25/2010   OPEN WOUND KNEE [place] WITHOUT MENTION COMP 03/26/2010    PCP: Lawerance Cruel, MD  REFERRING PROVIDER: Larene Pickett, PA-C  REFERRING DIAG: sciatica  Rationale for Evaluation and Treatment Rehabilitation  THERAPY DIAG:  Muscle weakness (generalized)  Pain in left hip  ONSET DATE: chronic, worsening symptoms recently August 2023.  SUBJECTIVE:                                                                                                                                                                                           SUBJECTIVE STATEMENT: The patient reports he can get his socks on easier since beginning PT.  He had some L hip pain after mowing the yard this week. The patient notes he is now able to do reciprocal pattern on stairs instead of step to pattern.     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    PAIN:  Are you having pain? Yes: NPRS scale: 0/10 Pain location: L glut region Pain description: none current Aggravating factors: walking for longer distances Relieving factors: tylenol , stretch Pain radiates into the L leg *Pain feels like a nail at  times when flared up   PRECAUTIONS: None  WEIGHT BEARING RESTRICTIONS No  PATIENT GOALS Reduce pain in L gluteal region, walk without pain   OBJECTIVE:  TREATMENT  06/03/22: Treadmill x 4 minutes up to 1.3 mph adding 2% incline for a portion of walk.  No pain  Seated: Piriformis stretch L and R x 30 seconds x 2 reps  Supine: Hamstring stretch R and L supine x 30 seconds x 2 reps Contract/relax  with P/ROM hamstring stretch Trunk rotation  Single knee to chest R and L x 30 second holds  Standing: Step ups to 6" step R and L x 10 without UE support Backwards walking x 20 feet x 4 reps Heel and toe walking with cues for posture Single leg standing reducing UE support Alternating lunging R and L with arms abducted Sidestepping x 8 feet x 5 reps R and L with arms abducted  Sit to stand x 10 reps with overhead reaching for core extension/engagement  06/01/22: Treadmill up to 1.3 mph x 4 minutes, without pain.  Standing Gastroc stretch R and L x 30 seconds x 2 reps Single leg standing near support surface x 5 seconds x 4 reps R and L Heel raises x 20 repetitions Backwards walking x 20 feet x 3 reps with SBA for safety Sidestepping x 8 reps x 4 reps  Supine Trunk rotation x 5 reps R and L Bridges x 10 reps SLR x 10 reps with 2 lb weights Piriformis stretch  Sidelying Hip abduction x 10 reps with 2 lb weights  Seated HS stretch Piriformis stretch     05/27/22: Treadmill up to 1.3 mph x 4 minutes without pain Standing foam eyes open and eyes closed and reaching overhead for postural stability Standing rocker board lateral tilting Standing heel raises unilateral stance with bilat UE support x 10 reps Standing mini squat with W arm scap retraction when rising x 10 reps Standing SLS x 5-10 seconds Standing hip adductor stretch x 3 reps R and L sides Seated HS stretch R and L x 30 seconds Standing heel cord stretch  Sit<>stand with overhead reaching x 10 reps Supine  SLR x 10 reps R and L Supine trunk rotation Supine bridges x 10 reps Backwards walking x 20 feet x 4 reps Sidestepping with cues on upright posture Gait with emphasis on bilateral heel strike x 100 feet x 2 reps   PATIENT EDUCATION:  Education details: HEP, plan of care Person educated: Patient Education method: Explanation, Demonstration, Verbal cues, and Handouts Education comprehension: verbalized understanding, returned demonstration, and needs further education   HOME EXERCISE PROGRAM: Access Code: [email or web address] URL: [email or web address] Date: 05/25/2022 Prepared by: Rudell Cobb  Exercises - Sit to Stand Without Arm Support  - 2 x daily - 7 x weekly - 1 sets - 10 reps - Seated Hamstring Stretch with Chair  - 2 x daily - 7 x weekly - 1 sets - 2 reps - 20-30 seconds hold - Seated Figure 4 Piriformis Stretch  - 2 x daily - 7 x weekly - 1 sets - 2 reps - 30 seconds hold - Single Leg Stance with Support  - 2 x daily - 7 x weekly - 1 sets - 3 reps - 10 seconds hold - Backward Walking with Counter Support  - 2 x daily - 7 x weekly - 1 sets - 10 reps - Sidelying Hip Abduction  - 2 x daily - 7 x weekly - 1 sets - 10 reps  ASSESSMENT:  CLINICAL IMPRESSION: The patient is noting increased functional strength.  PT continuing to progress strengthening, stretching, balance and general mobility.  We are continuing to work to [place] to reduce to 1x/week to allow gym routine 1x/week for d/c planning.  OBJECTIVE IMPAIRMENTS Abnormal gait, decreased activity tolerance, decreased balance, hypomobility, increased fascial restrictions, impaired flexibility, postural dysfunction, and pain.   REHAB POTENTIAL: Good  GOALS: Goals reviewed with patient? Yes  LONG TERM GOALS:  Target date: 06/29/2022  The patient will be indep with HEP progression. Baseline: No current HEP Goal status: ACHIEVED 06/03/22  2.  The patient will maintain FOTO score of 70%. Baseline: 70%, risk  adjusted score to 47%. Goal status: INITIAL  3.  The patient will demonstrate improved standing posture for ADLs and gait. Baseline:  flexed trunk positioning Goal status: ACHIEVED 06/03/22  4.  The patient will report reduction in frequency and intensity of L glut pain to < or equal to 2 days/week with household activities. Baseline:  depends on activities. Goal status: INITIAL  5.  The patient will demo single leg stance x 10 seconds bilat. Baseline: needs UE intermittent support Goal status: INITIAL  PLAN: PT FREQUENCY: 2x/week  PT DURATION: 6 weeks  PLANNED INTERVENTIONS: Therapeutic exercises, Therapeutic activity, Neuromuscular re-education, Balance training, Gait training, Patient/Family education, Self Care, Joint mobilization, Dry Needling, Electrical stimulation, Cryotherapy, Moist heat, Taping, Traction, and Manual therapy.  PLAN FOR NEXT SESSION:   reduce frequency next week to 1x/week and begin to transition to community classes, Check HEP progressing to tolerance. Progress there ex focusing on: posture, ROM In lumbar spine, hip flexor stretching, treadmill   Kielan Dreisbach, PT 06/03/2022, 11:46 AM'

## 2022-06-10 ENCOUNTER — Encounter: Payer: Self-pay | Admitting: Rehabilitative and Restorative Service Providers"

## 2022-06-10 ENCOUNTER — Ambulatory Visit: Payer: Medicare Other | Attending: Student | Admitting: Rehabilitative and Restorative Service Providers"

## 2022-06-10 DIAGNOSIS — M6281 Muscle weakness (generalized): Secondary | ICD-10-CM | POA: Diagnosis not present

## 2022-06-10 DIAGNOSIS — M25552 Pain in left hip: Secondary | ICD-10-CM

## 2022-06-10 NOTE — Therapy (Signed)
OUTPATIENT PHYSICAL THERAPY TREATMENT   Patient Name: Sean Mullen MRN: [id number] DOB:[birth year], 76 y.o., male Today's Date: 06/10/2022   PT End of Session - 06/10/22 1111     Visit Number 7    Number of Visits 12    Date for PT Re-Evaluation 06/29/22    Authorization Type [employer] medicare    Progress Note Due on Visit 10    PT Start Time 1111    PT Stop Time 1150    PT Time Calculation (min) 39 min    Activity Tolerance Patient tolerated treatment well    Behavior During Therapy WFL for tasks assessed/performed              Past Medical History:  Diagnosis Date   Hyperlipidemia    Past Surgical History:  Procedure Laterality Date   HIP SURGERY     TONSILLECTOMY     Patient Active Problem List   Diagnosis Date Noted   EPIDIDYMITIS, RIGHT 09/25/2010   OPEN WOUND KNEE LEG&ANK WITHOUT MENTION COMP 03/26/2010    PCP: Lawerance Cruel, MD  REFERRING PROVIDER: Larene Pickett, PA-C  REFERRING DIAG: sciatica  Rationale for Evaluation and Treatment Rehabilitation  THERAPY DIAG:  Muscle weakness (generalized)  Pain in left hip  ONSET DATE: chronic, worsening symptoms recently August 2023.  SUBJECTIVE:                                                                                                                                                                                           SUBJECTIVE STATEMENT: The patient reports no pain in the L hip and no further need to take meds for pain.   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    PAIN:  Are you having pain? No  PRECAUTIONS: None  WEIGHT BEARING RESTRICTIONS No  PATIENT GOALS Reduce pain in L gluteal region, walk without pain   OBJECTIVE:  TREATMENT  06/10/22: Treadmill x 5 minutes up to 1.3 mph adding 2% incline for a portion of the walk.  Supine: Hamstring stretch R and L supine x 30 seconds x 2 reps Trunk rotation  Bridges x 10 reps Bridges with marching SLR  R and L x 5 reps with slow eccentric lowering  Standing: Step ups to 6" step R and L x 10 doing "up/up, down/down" Single leg standing reducing UE support Sidestepping x 8 feet x 5 reps R and L with arms abducted adding 5 lb weights Squats x 10 reps focusing on technique   FOTO 88%  06/03/22: Treadmill x 4 minutes up to 1.3 mph adding 2% incline for  a portion of walk.  No pain  Seated: Piriformis stretch L and R x 30 seconds x 2 reps  Supine: Hamstring stretch R and L supine x 30 seconds x 2 reps Contract/relax with P/ROM hamstring stretch Trunk rotation  Single knee to chest R and L x 30 second holds  Standing: Step ups to 6" step R and L x 10 without UE support Backwards walking x 20 feet x 4 reps Heel and toe walking with cues for posture Single leg standing reducing UE support Alternating lunging R and L with arms abducted Sidestepping x 8 feet x 5 reps R and L with arms abducted  Sit to stand x 10 reps with overhead reaching for core extension/engagement  06/01/22: Treadmill up to 1.3 mph x 4 minutes, without pain.  Standing Gastroc stretch R and L x 30 seconds x 2 reps Single leg standing near support surface x 5 seconds x 4 reps R and L Heel raises x 20 repetitions Backwards walking x 20 feet x 3 reps with SBA for safety Sidestepping x 8 reps x 4 reps  Supine Trunk rotation x 5 reps R and L Bridges x 10 reps SLR x 10 reps with 2 lb weights Piriformis stretch  Sidelying Hip abduction x 10 reps with 2 lb weights  Seated HS stretch Piriformis stretch   PATIENT EDUCATION:  Education details: HEP, plan of care Person educated: Patient Education method: Explanation, Demonstration, Verbal cues, and Handouts Education comprehension: verbalized understanding, returned demonstration, and needs further education   HOME EXERCISE PROGRAM: Access Code: [email or web address] URL: [email or web address] Date: 05/25/2022 Prepared by: Rudell Cobb  Exercises - Sit to Stand Without Arm Support  - 2 x daily - 7 x weekly - 1 sets - 10 reps - Seated Hamstring Stretch with Chair  - 2 x daily - 7 x weekly - 1 sets - 2 reps - 20-30 seconds hold - Seated Figure 4 Piriformis Stretch  - 2 x daily - 7 x weekly - 1 sets - 2 reps - 30 seconds hold - Single Leg Stance with Support  - 2 x daily - 7 x weekly - 1 sets - 3 reps - 10 seconds hold - Backward Walking with Counter Support  - 2 x daily - 7 x weekly - 1 sets - 10 reps - Sidelying Hip Abduction  - 2 x daily - 7 x weekly - 1 sets - 10 reps  ASSESSMENT:  CLINICAL IMPRESSION: The patient is continuing to show improvement.  He plans to return to community exercise this week and we will work towards goals for d/c.  Plan to check LTGs next week and determine d/c plan. The patient has met 4/5 LTGs.  OBJECTIVE IMPAIRMENTS Abnormal gait, decreased activity tolerance, decreased balance, hypomobility, increased fascial restrictions, impaired flexibility, postural dysfunction, and pain.   REHAB POTENTIAL: Good  GOALS: Goals reviewed with patient? Yes  LONG TERM GOALS: Target date: 06/29/2022  The patient will be indep with HEP progression. Baseline: No current HEP Goal status: ACHIEVED 06/03/22  2.  The patient will maintain FOTO score of 70%. Baseline: 70%, risk adjusted score to 47%. Goal status: ACHIEVED 88% ON 06/10/22  3.  The patient will demonstrate improved standing posture for ADLs and gait. Baseline:  flexed trunk positioning Goal status: ACHIEVED 06/03/22  4.  The patient will report reduction in frequency and intensity of L glut pain to < or equal to 2 days/week with household activities. Baseline:  depends on activities. Goal status:  ACHIEVED 06/10/22  5.  The patient will demo single leg stance x 10 seconds bilat. Baseline: needs UE intermittent support Goal status: ONGOING  PLAN: PT FREQUENCY: 2x/week  PT DURATION: 6 weeks  PLANNED INTERVENTIONS: Therapeutic  exercises, Therapeutic activity, Neuromuscular re-education, Balance training, Gait training, Patient/Family education, Self Care, Joint mobilization, Dry Needling, Electrical stimulation, Cryotherapy, Moist heat, Taping, Traction, and Manual therapy.  PLAN FOR NEXT SESSION:   PLAN d/c.  Reduce frequency next week to 1x/week and begin to transition to community classes, Check HEP progressing to tolerance. Progress there ex focusing on: posture, ROM In lumbar spine, hip flexor stretching, treadmill   Aniesha Haughn, PT 06/10/2022, 11:13 AM'

## 2022-06-17 ENCOUNTER — Encounter: Payer: Self-pay | Admitting: Rehabilitative and Restorative Service Providers"

## 2022-06-17 ENCOUNTER — Ambulatory Visit: Payer: Medicare Other | Admitting: Rehabilitative and Restorative Service Providers"

## 2022-06-17 DIAGNOSIS — M25552 Pain in left hip: Secondary | ICD-10-CM | POA: Diagnosis not present

## 2022-06-17 DIAGNOSIS — M6281 Muscle weakness (generalized): Secondary | ICD-10-CM | POA: Diagnosis not present

## 2022-06-17 NOTE — Therapy (Signed)
OUTPATIENT PHYSICAL THERAPY TREATMENT and DISCHARGE SUMMARY   Patient Name: Sean Mullen MRN: [id number] DOB:August 27, 1946, 76 y.o., male Today's Date: 06/17/2022  PHYSICAL THERAPY DISCHARGE SUMMARY  Visits from Start of Care: 8  Current functional level related to goals / functional outcomes: SEE BELOW   Remaining deficits: Patient has HEP for balance and continued strengthening   Education / Equipment: HEP, community exercise class   Patient agrees to discharge. Patient goals were met. Patient is being discharged due to meeting the stated rehab goals.     PT End of Session - 06/17/22 1022     Visit Number 8    Number of Visits 12    Date for PT Re-Evaluation 06/29/22    Authorization Type [employer] medicare    Progress Note Due on Visit 10    PT Start Time 1017    PT Stop Time 1055    PT Time Calculation (min) 38 min    Activity Tolerance Patient tolerated treatment well    Behavior During Therapy WFL for tasks assessed/performed               Past Medical History:  Diagnosis Date   Hyperlipidemia    Past Surgical History:  Procedure Laterality Date   HIP SURGERY     TONSILLECTOMY     Patient Active Problem List   Diagnosis Date Noted   EPIDIDYMITIS, RIGHT 09/25/2010   OPEN WOUND KNEE LEG&ANK WITHOUT MENTION COMP 03/26/2010    PCP: Lawerance Cruel, MD  REFERRING PROVIDER: Larene Pickett, PA-C  REFERRING DIAG: sciatica  Rationale for Evaluation and Treatment Rehabilitation  THERAPY DIAG:  Muscle weakness (generalized)  Pain in left hip  ONSET DATE: chronic, worsening symptoms recently August 2023.  SUBJECTIVE:                                                                                                                                                                                           SUBJECTIVE STATEMENT: No pain and home exercises going well.  Balance feels about the same.  PERTINENT HISTORY:  H/o L hip dislocation in childhood,  blockage in R leg.    PAIN:  Are you having pain? No  PRECAUTIONS: None  WEIGHT BEARING RESTRICTIONS No  PATIENT GOALS Reduce pain in L gluteal region, walk without pain   OBJECTIVE:  TREATMENT  06/17/22: THEREX Treadmill warm up 1.2 mph at 2% incline x 4 minutes.  Standing  Heel raises x 10 reps with ball overhead Single leg stance near support surface Sidestepping with 6 lb weight at 90 degrees bilat UE support Step ups x 10 reps at 6" step Wall  slides x 10 reps Tandem walking with CGA Backwards walking x 20 feet x 4 reps Marching with high knees x 20 feet x 4 reps  Seated Piriformis stretching x R and L x 30 seconds  Supine Trunk rotation x 10 reps HS stretch supine Bridges x 10 reps Bridges with marching  06/10/22: Treadmill x 5 minutes up to 1.3 mph adding 2% incline for a portion of the walk.  Supine: Hamstring stretch R and L supine x 30 seconds x 2 reps Trunk rotation  Bridges x 10 reps Bridges with marching SLR R and L x 5 reps with slow eccentric lowering  Standing: Step ups to 6" step R and L x 10 doing "up/up, down/down" Single leg standing reducing UE support Sidestepping x 8 feet x 5 reps R and L with arms abducted adding 5 lb weights Squats x 10 reps focusing on technique   FOTO 88%  06/03/22: Treadmill x 4 minutes up to 1.3 mph adding 2% incline for a portion of walk.  No pain  Seated: Piriformis stretch L and R x 30 seconds x 2 reps  Supine: Hamstring stretch R and L supine x 30 seconds x 2 reps Contract/relax with P/ROM hamstring stretch Trunk rotation  Single knee to chest R and L x 30 second holds  Standing: Step ups to 6" step R and L x 10 without UE support Backwards walking x 20 feet x 4 reps Heel and toe walking with cues for posture Single leg standing reducing UE support Alternating lunging R and L with arms abducted Sidestepping x 8 feet x 5 reps R and L with arms abducted  Sit to stand x 10 reps with overhead reaching  for core extension/engagement    PATIENT EDUCATION:  Education details: HEP, plan of care Person educated: Patient Education method: Explanation, Demonstration, Verbal cues, and Handouts Education comprehension: verbalized understanding, returned demonstration, and needs further education   HOME EXERCISE PROGRAM: Access Code: [email or web address] URL: [email or web address] Date: 06/17/2022 Prepared by: Rudell Cobb  Exercises - Sit to Stand Without Arm Support  - 1 x daily - 7 x weekly - 1 sets - 10 reps - Seated Hamstring Stretch with Chair  - 2 x daily - 7 x weekly - 1 sets - 2 reps - 20-30 seconds hold - Seated Figure 4 Piriformis Stretch  - 2 x daily - 7 x weekly - 1 sets - 2 reps - 30 seconds hold - Single Leg Stance with Support  - 2 x daily - 7 x weekly - 1 sets - 3 reps - 10 seconds hold - Backward Walking with Counter Support  - 1 x daily - 7 x weekly - 1 sets - 10 reps - Tandem Walking with Counter Support  - 1 x daily - 7 x weekly - 1 sets - 10 reps   ASSESSMENT:  CLINICAL IMPRESSION: The patient has met all LTGs.  He notes pain free in the L hip and feeling stronger.  PT also has provided HEP for balance, and continued strength, as well as referral to silver sneakers program.  OBJECTIVE IMPAIRMENTS Abnormal gait, decreased activity tolerance, decreased balance, hypomobility, increased fascial restrictions, impaired flexibility, postural dysfunction, and pain.   REHAB POTENTIAL: Good  GOALS: Goals reviewed with patient? Yes  LONG TERM GOALS: Target date: 06/29/2022  The patient will be indep with HEP progression. Baseline: No current HEP Goal status: ACHIEVED 06/03/22  2.  The patient will maintain FOTO score of 70%. Baseline: 70%, risk adjusted  score to 47%. Goal status: ACHIEVED 88% ON 06/10/22  3.  The patient will demonstrate improved standing posture for ADLs and gait. Baseline:  flexed trunk positioning Goal status: ACHIEVED 06/03/22  4.  The  patient will report reduction in frequency and intensity of L glut pain to < or equal to 2 days/week with household activities. Baseline:  depends on activities. Goal status: ACHIEVED 06/10/22  5.  The patient will demo single leg stance x 10 seconds bilat. Baseline: needs UE intermittent support Goal status: ACHIEVED 06/17/22  PLAN: PT FREQUENCY: 2x/week  PT DURATION: 6 weeks  PLANNED INTERVENTIONS: Therapeutic exercises, Therapeutic activity, Neuromuscular re-education, Balance training, Gait training, Patient/Family education, Self Care, Joint mobilization, Dry Needling, Electrical stimulation, Cryotherapy, Moist heat, Taping, Traction, and Manual therapy.  PLAN FOR NEXT SESSION:   discharge from PT with HEP and referral to community exercise class.   [place], PT 06/17/2022, 2:11 PM'

## 2022-12-18 DIAGNOSIS — G4733 Obstructive sleep apnea (adult) (pediatric): Secondary | ICD-10-CM | POA: Diagnosis not present

## 2022-12-23 ENCOUNTER — Other Ambulatory Visit: Payer: Self-pay | Admitting: Family Medicine

## 2022-12-23 DIAGNOSIS — R0602 Shortness of breath: Secondary | ICD-10-CM | POA: Diagnosis not present

## 2022-12-23 DIAGNOSIS — F17200 Nicotine dependence, unspecified, uncomplicated: Secondary | ICD-10-CM

## 2022-12-23 DIAGNOSIS — B351 Tinea unguium: Secondary | ICD-10-CM | POA: Diagnosis not present

## 2022-12-23 DIAGNOSIS — Z136 Encounter for screening for cardiovascular disorders: Secondary | ICD-10-CM

## 2022-12-23 DIAGNOSIS — Z0000 Encounter for general adult medical examination without abnormal findings: Secondary | ICD-10-CM | POA: Diagnosis not present

## 2022-12-23 DIAGNOSIS — E782 Mixed hyperlipidemia: Secondary | ICD-10-CM | POA: Diagnosis not present

## 2023-01-22 ENCOUNTER — Other Ambulatory Visit: Payer: Medicare Other

## 2023-01-22 ENCOUNTER — Ambulatory Visit: Payer: Medicare Other

## 2023-01-26 ENCOUNTER — Ambulatory Visit
Admission: RE | Admit: 2023-01-26 | Discharge: 2023-01-26 | Disposition: A | Discharge status: Home / Self Care | Payer: Medicare Other | Source: Ambulatory Visit | Attending: Family Medicine | Admitting: Family Medicine

## 2023-01-26 DIAGNOSIS — Z122 Encounter for screening for malignant neoplasm of respiratory organs: Secondary | ICD-10-CM | POA: Diagnosis not present

## 2023-01-26 DIAGNOSIS — Z136 Encounter for screening for cardiovascular disorders: Secondary | ICD-10-CM | POA: Diagnosis not present

## 2023-01-26 DIAGNOSIS — Z87891 Personal history of nicotine dependence: Secondary | ICD-10-CM | POA: Diagnosis not present

## 2023-01-26 DIAGNOSIS — J9819 Other pulmonary collapse: Secondary | ICD-10-CM | POA: Diagnosis not present

## 2023-01-26 DIAGNOSIS — F17200 Nicotine dependence, unspecified, uncomplicated: Secondary | ICD-10-CM

## 2023-01-26 DIAGNOSIS — J90 Pleural effusion, not elsewhere classified: Secondary | ICD-10-CM | POA: Diagnosis not present

## 2023-02-12 DIAGNOSIS — R3915 Urgency of urination: Secondary | ICD-10-CM | POA: Diagnosis not present

## 2023-03-26 DIAGNOSIS — B351 Tinea unguium: Secondary | ICD-10-CM | POA: Diagnosis not present

## 2023-06-17 DIAGNOSIS — R3915 Urgency of urination: Secondary | ICD-10-CM | POA: Diagnosis not present

## 2023-06-29 DIAGNOSIS — B351 Tinea unguium: Secondary | ICD-10-CM | POA: Diagnosis not present

## 2023-06-29 DIAGNOSIS — E782 Mixed hyperlipidemia: Secondary | ICD-10-CM | POA: Diagnosis not present

## 2023-09-21 ENCOUNTER — Other Ambulatory Visit: Payer: Self-pay | Admitting: Family Medicine

## 2023-09-21 DIAGNOSIS — R911 Solitary pulmonary nodule: Secondary | ICD-10-CM

## 2023-09-21 DIAGNOSIS — F17200 Nicotine dependence, unspecified, uncomplicated: Secondary | ICD-10-CM

## 2023-09-21 DIAGNOSIS — J90 Pleural effusion, not elsewhere classified: Secondary | ICD-10-CM

## 2023-09-21 DIAGNOSIS — J439 Emphysema, unspecified: Secondary | ICD-10-CM

## 2023-09-27 DIAGNOSIS — B351 Tinea unguium: Secondary | ICD-10-CM | POA: Diagnosis not present

## 2023-09-27 DIAGNOSIS — R0602 Shortness of breath: Secondary | ICD-10-CM | POA: Diagnosis not present

## 2023-09-27 DIAGNOSIS — E782 Mixed hyperlipidemia: Secondary | ICD-10-CM | POA: Diagnosis not present

## 2023-10-07 DIAGNOSIS — L570 Actinic keratosis: Secondary | ICD-10-CM | POA: Diagnosis not present

## 2023-10-07 DIAGNOSIS — D485 Neoplasm of uncertain behavior of skin: Secondary | ICD-10-CM | POA: Diagnosis not present

## 2023-10-07 DIAGNOSIS — C44319 Basal cell carcinoma of skin of other parts of face: Secondary | ICD-10-CM | POA: Diagnosis not present

## 2023-10-07 DIAGNOSIS — D1801 Hemangioma of skin and subcutaneous tissue: Secondary | ICD-10-CM | POA: Diagnosis not present

## 2023-10-07 DIAGNOSIS — L814 Other melanin hyperpigmentation: Secondary | ICD-10-CM | POA: Diagnosis not present

## 2023-10-07 DIAGNOSIS — L821 Other seborrheic keratosis: Secondary | ICD-10-CM | POA: Diagnosis not present

## 2023-10-07 DIAGNOSIS — D692 Other nonthrombocytopenic purpura: Secondary | ICD-10-CM | POA: Diagnosis not present

## 2023-12-17 DIAGNOSIS — H6123 Impacted cerumen, bilateral: Secondary | ICD-10-CM | POA: Diagnosis not present

## 2024-01-06 DIAGNOSIS — K5790 Diverticulosis of intestine, part unspecified, without perforation or abscess without bleeding: Secondary | ICD-10-CM | POA: Diagnosis not present

## 2024-01-06 DIAGNOSIS — E782 Mixed hyperlipidemia: Secondary | ICD-10-CM | POA: Diagnosis not present

## 2024-01-06 DIAGNOSIS — B351 Tinea unguium: Secondary | ICD-10-CM | POA: Diagnosis not present

## 2024-01-06 DIAGNOSIS — R0602 Shortness of breath: Secondary | ICD-10-CM | POA: Diagnosis not present

## 2024-01-06 DIAGNOSIS — J439 Emphysema, unspecified: Secondary | ICD-10-CM | POA: Diagnosis not present

## 2024-01-06 DIAGNOSIS — Z0000 Encounter for general adult medical examination without abnormal findings: Secondary | ICD-10-CM | POA: Diagnosis not present

## 2024-01-06 DIAGNOSIS — Z23 Encounter for immunization: Secondary | ICD-10-CM | POA: Diagnosis not present

## 2024-03-23 ENCOUNTER — Ambulatory Visit
Admission: RE | Admit: 2024-03-23 | Discharge: 2024-03-23 | Disposition: A | Discharge status: Home / Self Care | Source: Ambulatory Visit | Attending: Family Medicine | Admitting: Family Medicine

## 2024-03-23 DIAGNOSIS — F17200 Nicotine dependence, unspecified, uncomplicated: Secondary | ICD-10-CM

## 2024-03-23 DIAGNOSIS — J439 Emphysema, unspecified: Secondary | ICD-10-CM | POA: Diagnosis not present

## 2024-03-23 DIAGNOSIS — R911 Solitary pulmonary nodule: Secondary | ICD-10-CM

## 2024-03-23 DIAGNOSIS — J90 Pleural effusion, not elsewhere classified: Secondary | ICD-10-CM

## 2024-04-06 ENCOUNTER — Other Ambulatory Visit (HOSPITAL_COMMUNITY): Payer: Self-pay | Admitting: Family Medicine

## 2024-04-06 DIAGNOSIS — R918 Other nonspecific abnormal finding of lung field: Secondary | ICD-10-CM

## 2024-04-13 ENCOUNTER — Ambulatory Visit (HOSPITAL_COMMUNITY)
Admission: RE | Admit: 2024-04-13 | Discharge: 2024-04-13 | Disposition: A | Discharge status: Home / Self Care | Source: Ambulatory Visit | Attending: Family Medicine | Admitting: Family Medicine

## 2024-04-13 DIAGNOSIS — R918 Other nonspecific abnormal finding of lung field: Secondary | ICD-10-CM | POA: Diagnosis not present

## 2024-04-13 LAB — GLUCOSE, CAPILLARY: Glucose-Capillary: 89 mg/dL (ref 70–99)

## 2024-04-13 MED ORDER — FLUDEOXYGLUCOSE F - 18 (FDG) INJECTION
11.6000 | Freq: Once | INTRAVENOUS | Status: AC
  Administered 2024-04-13: 11.715 via INTRAVENOUS

## 2024-05-03 DIAGNOSIS — J309 Allergic rhinitis, unspecified: Secondary | ICD-10-CM | POA: Diagnosis not present

## 2024-05-26 ENCOUNTER — Ambulatory Visit: Admitting: Internal Medicine

## 2024-05-26 ENCOUNTER — Encounter: Payer: Self-pay | Admitting: Internal Medicine

## 2024-05-26 VITALS — BP 132/70 | HR 65 | Temp 97.8°F | Ht 72.0 in | Wt 211.0 lb

## 2024-05-26 DIAGNOSIS — R918 Other nonspecific abnormal finding of lung field: Secondary | ICD-10-CM | POA: Diagnosis not present

## 2024-05-26 DIAGNOSIS — Z122 Encounter for screening for malignant neoplasm of respiratory organs: Secondary | ICD-10-CM

## 2024-05-26 DIAGNOSIS — J90 Pleural effusion, not elsewhere classified: Secondary | ICD-10-CM

## 2024-05-26 DIAGNOSIS — J449 Chronic obstructive pulmonary disease, unspecified: Secondary | ICD-10-CM

## 2024-05-26 DIAGNOSIS — F17210 Nicotine dependence, cigarettes, uncomplicated: Secondary | ICD-10-CM

## 2024-05-26 DIAGNOSIS — R0602 Shortness of breath: Secondary | ICD-10-CM

## 2024-05-26 DIAGNOSIS — F17200 Nicotine dependence, unspecified, uncomplicated: Secondary | ICD-10-CM

## 2024-05-26 MED ORDER — ALBUTEROL SULFATE (2.5 MG/3ML) 0.083% IN NEBU
2.5000 mg | INHALATION_SOLUTION | Freq: Four times a day (QID) | RESPIRATORY_TRACT | 12 refills | Status: AC | PRN
Start: 2024-05-26 — End: ?

## 2024-05-26 NOTE — Patient Instructions (Signed)
 It was a pleasure to see you today!  Please schedule follow up with myself in 2 months.  If my schedule is not open yet, we will contact you with a reminder closer to that time. Please call [phone] if you haven't heard from us  a month before, and always call us  sooner if issues or concerns arise. You can also send us  a message through MyChart, but but aware that this is not to be used for urgent issues and it may take up to 5-7 days to receive a reply. Please be aware that you will likely be able to view your results before I have a chance to respond to them. Please give us  5 business days to respond to any non-urgent results.    Before your next visit I would like you to have: Ultrasound guided thoracentesis Full set of PFTs  VISIT SUMMARY:  You came in today due to shortness of breath, which you experience mainly during physical activities. We discussed your history of smoking and the results of your recent CT and PET scans. We also reviewed your use of a CPAP machine for sleep apnea and your family history of cancer and tuberculosis.  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 You also have fluid around your right lung, which could be related to cancer. You will also get a nebulizer machine and albuterol  to help with your breathing as needed. We will do pulmonary function tests to see how severe your COPD is.  -RIGHT LOWER LOBE LUNG MASS (LIKELY MUCUS PLUG/ATELECTASIS): A mass in the right lower part of your lung was found on your CT scan, but the PET scan suggests it is not cancerous and is likely a mucus plug or collapsed lung tissue. We will repeat the CT scan in six months to check on it again. If it is still there, we may do a bronchoscopy to look closer and rule out cancer.  Right pleural effusion Ultrasound shows fluid around  the right lung. I have ordered a procedure to have it drained.

## 2024-05-26 NOTE — Progress Notes (Signed)
 Sean Mullen    [id number]    21-Dec-1945  Primary Care Physician:Ross, Carlin Redbird, MD  Referring Physician: Marvene Prentice SAUNDERS, FNP [phone] [address] [place],  [place] [address] Reason for Consultation: shortness of breath Date of Consultation: 05/26/2024  Chief complaint:   Chief Complaint  Patient presents with   Consult    Referred by PCP to follow-up on PET and CT scan   Shortness of Breath    On activity     HPI:  Discussed the use of AI scribe software for clinical note transcription with the patient, who gave verbal consent to proceed.  History of Present Illness Sean Mullen is a 78 year old male who presents with shortness of breath. He was referred by Sean Mullen, his primary care provider, for evaluation of his breathing difficulties.  He experiences shortness of breath primarily during physical exertion, such as walking to the mailbox. He describes himself as a 'mouth breather' and notes a history of nasal congestion, which he attributes to a larger nostril on one side. No chest tightness, wheezing, or history of pneumonia or bronchitis. He has not used any inhalers or breathing treatments for his symptoms.  He has a significant smoking history, having worked in a tobacco factory for 39.5 years and started smoking at age 73. At his heaviest, he smoked about half a pack per day, but has since reduced to about a pack per week, especially after retiring 15 years ago. He mentions that he does not smoke indoors and some days does not smoke at all.  He has a history of skin cancer and bowel surgery. He also uses a CPAP machine for sleep apnea, although it is not cleaned as regularly as it should be. No chest pain.  Family history is notable for tuberculosis on his father's side and cancer on both his mother's and father's sides. His mother had a kidney removed due to cancer but lived to 78 years old. There is no family history of lung cancer or  COPD.  He underwent a CT scan and a PET scan due to his smoking history, which revealed an abnormality in the right lower lung. He also has fluid accumulation on the outside of the right lung, which has been present for at least a year.   Social history:  Occupation: retired from tobacco factory Exposures: lives at home with wife.  Smoking history: current 1/4 pack per day smoker.   Social History   Occupational History   Not on file  Tobacco Use   Smoking status: Some Days    Current packs/day: 0.25    Average packs/day: 0.4 packs/day for 55.6 years (23.9 ttl pk-yrs)    Types: Cigarettes    Start date: 1970   Smokeless tobacco: Never   Tobacco comments:    Started smoking at age 31.  1 pack a week. 05/26/2024  Vaping Use   Vaping status: Never Used  Substance and Sexual Activity   Alcohol use: Yes   Drug use: No   Sexual activity: Not on file    Relevant family history:  Family History  Problem Relation Age of Onset   Renal cancer Mother    Cancer Father    Diabetes Sister    Dementia Sister     Past Medical History:  Diagnosis Date   Hyperlipidemia     Past Surgical History:  Procedure Laterality Date   HIP SURGERY  TONSILLECTOMY       Physical Exam: Blood pressure 132/70, pulse 65, temperature 97.8 F (36.6 C), temperature source Oral, height 6' (1.829 m), weight 211 lb (95.7 kg), SpO2 92%. Gen:      No acute distress ENT:  nasal congestion, s/p moh's surgery, right nare more patent than left, no nasal polyps, mucus membranes moist Lungs:    No increased respiratory effort, symmetric chest wall excursion, clear to auscultation bilaterally, no wheezes or crackles CV:         Regular rate and rhythm; no murmurs, rubs, or gallops.  No pedal edema Abd:      + bowel sounds; soft, non-tender; no distension MSK: no acute synovitis of DIP or PIP joints, no mechanics hands.  Skin:      Warm and dry; no rashes Neuro: normal speech, some hearing and memory  and cognitition issues evident Psych: alert and oriented x3, normal mood and affect   Data Reviewed/Medical Decision Making:  Independent interpretation of tests: Imaging:  Review of patient's pet scan  images revealed RLL rounded atelectasis, right pleural effusion. The patient's images have been independently reviewed by me.    Right pleural space u/s 8.22   PFTs:    Labs: No results found for: NA, K, CO2, GLUCOSE, BUN, CREATININE, CALCIUM, GFR, EGFR, GFRNONAA No results found for: WBC, HGB, HCT, MCV, PLT   Immunization status:  Immunization History  Administered Date(s) Administered   Moderna Sars-Covid-2 Vaccination 11/17/2019, 12/15/2019     I reviewed prior external note(s) from primary care  I reviewed the result(s) of the labs and imaging as noted above.   I have ordered pft, thoracentesis  Assessment and Plan Assessment & Plan COPD with chronic tobacco use and right pleural effusion Chronic obstructive pulmonary disease exacerbated by smoking and possible pleural effusion. Right pleural effusion present for over a year, differential includes cancer. Smoking history increases lung cancer risk. No current inhaler or breathing treatments. Potential benefit from pleural effusion drainage. - lung u/s shows ongoing free flowing effusion - will order u/s guided thoracentesis in IR - send for cell count, cytology, glucose, protein LDH and gramstain/culture - Order nebulizer machine and albuterol  for as-needed use. - Conduct pulmonary function tests to assess COPD severity.  Right lower lobe lung mass (likely mucus plug/atelectasis) Right lower lobe lung mass on CT scan, PET scan showed no increased uptake, suggesting non-cancerous etiology, likely mucus plug or atelectasis. Plan to monitor and reassess in six months. - Repeat CT scan in six months to reassess right lower lobe mass. - If mass persists, consider bronchoscopy to evaluate for  potential endobronchial lesion.  Patient has significant dementia but I was able to review plan of care with his wife.    Return to Care: Return in about 2 months (around 07/26/2024).  Sean Gore, MD Pulmonary and Critical Care Medicine [facility] [phone]  CC: Marvene Prentice SAUNDERS, FNP

## 2024-06-07 ENCOUNTER — Telehealth: Payer: Self-pay | Admitting: Internal Medicine

## 2024-06-07 ENCOUNTER — Ambulatory Visit: Admitting: Internal Medicine

## 2024-06-07 DIAGNOSIS — R0602 Shortness of breath: Secondary | ICD-10-CM

## 2024-06-07 LAB — PULMONARY FUNCTION TEST
DL/VA % pred: 83 %
DL/VA: 3.25 ml/min/mmHg/L
DLCO unc % pred: 72 %
DLCO unc: 18.19 ml/min/mmHg
FEF 25-75 Post: 2.69 L/s
FEF 25-75 Pre: 2.42 L/s
FEF2575-%Change-Post: 11 %
FEF2575-%Pred-Post: 127 %
FEF2575-%Pred-Pre: 114 %
FEV1-%Change-Post: 10 %
FEV1-%Pred-Post: 82 %
FEV1-%Pred-Pre: 74 %
FEV1-Post: 2.49 L
FEV1-Pre: 2.24 L
FEV1FVC-%Change-Post: 7 %
FEV1FVC-%Pred-Pre: 110 %
FEV6-%Change-Post: 3 %
FEV6-%Pred-Post: 74 %
FEV6-%Pred-Pre: 71 %
FEV6-Post: 2.92 L
FEV6-Pre: 2.83 L
FEV6FVC-%Pred-Post: 106 %
FEV6FVC-%Pred-Pre: 106 %
FVC-%Change-Post: 3 %
FVC-%Pred-Post: 69 %
FVC-%Pred-Pre: 67 %
FVC-Post: 2.92 L
FVC-Pre: 2.83 L
Post FEV1/FVC ratio: 85 %
Post FEV6/FVC ratio: 100 %
Pre FEV1/FVC ratio: 79 %
Pre FEV6/FVC Ratio: 100 %
RV % pred: 110 %
RV: 2.93 L
TLC % pred: 81 %
TLC: 5.82 L

## 2024-06-07 NOTE — Telephone Encounter (Signed)
 Patient needs a refill of his nebulizer medication.   Pharmacy: [facility] in [place]

## 2024-06-07 NOTE — Patient Instructions (Signed)
 Full pft performed today

## 2024-06-07 NOTE — Progress Notes (Signed)
 Full pft performed today

## 2024-06-08 NOTE — Telephone Encounter (Signed)
 Spoke with pharmacy as it looks like pt had refills and he does pharmacy will get this ready for him. Pt notified of this

## 2024-06-12 ENCOUNTER — Ambulatory Visit: Payer: Self-pay | Admitting: Internal Medicine

## 2024-07-05 DIAGNOSIS — H6123 Impacted cerumen, bilateral: Secondary | ICD-10-CM | POA: Diagnosis not present

## 2024-07-05 DIAGNOSIS — R0982 Postnasal drip: Secondary | ICD-10-CM | POA: Diagnosis not present

## 2024-07-05 DIAGNOSIS — J309 Allergic rhinitis, unspecified: Secondary | ICD-10-CM | POA: Diagnosis not present

## 2024-07-14 DIAGNOSIS — E782 Mixed hyperlipidemia: Secondary | ICD-10-CM | POA: Diagnosis not present

## 2024-07-14 DIAGNOSIS — J439 Emphysema, unspecified: Secondary | ICD-10-CM | POA: Diagnosis not present

## 2024-07-14 DIAGNOSIS — B351 Tinea unguium: Secondary | ICD-10-CM | POA: Diagnosis not present

## 2024-07-19 DIAGNOSIS — R350 Frequency of micturition: Secondary | ICD-10-CM | POA: Diagnosis not present

## 2024-09-20 ENCOUNTER — Ambulatory Visit: Admitting: Internal Medicine

## 2024-09-20 ENCOUNTER — Encounter: Payer: Self-pay | Admitting: Internal Medicine

## 2024-09-20 ENCOUNTER — Ambulatory Visit

## 2024-09-20 VITALS — BP 130/72 | HR 74 | Ht 72.0 in | Wt 216.0 lb

## 2024-09-20 DIAGNOSIS — R0602 Shortness of breath: Secondary | ICD-10-CM | POA: Diagnosis not present

## 2024-09-20 DIAGNOSIS — J90 Pleural effusion, not elsewhere classified: Secondary | ICD-10-CM

## 2024-09-20 DIAGNOSIS — J339 Nasal polyp, unspecified: Secondary | ICD-10-CM | POA: Diagnosis not present

## 2024-09-20 DIAGNOSIS — F17210 Nicotine dependence, cigarettes, uncomplicated: Secondary | ICD-10-CM

## 2024-09-20 NOTE — Progress Notes (Signed)
 [name]    [id number]    1946-03-19  Primary Care Physician:Ross, Carlin Redbird, MD Date of Appointment: 09/20/2024 Established Patient Visit  Chief complaint:   Chief Complaint  Patient presents with   Medical Management of Chronic Issues   Shortness of Breath    PFT done 06/07/24. He feels like his breathing has improved slightly since his last visit. He has occ cough with certain foods, grits for example.      HPI: Sean Mullen is a 78 y.o. man with tobacco use disorder and COPD, preserved spirometry as well as right pleural effusion. History of dementia.   Interval Updates:  Here for follow up. Using albuterol  intermittently. Stopped using because his breathing improved.  Was given some nasal sprays for a few weeks from ENT and feels his breathing is back to normal so he stopped taking it. But now the nasal congestion is back.   Didn't schedule thoracentesis because his wife thought they were going to try the nebulizer machine first so there was a misunderstanding.   Occasionally has aspiration episodes especially when he eats fast.   No fevers, chills, night sweats or weight loss.   Past Medical History:  Diagnosis Date   Hyperlipidemia     Past Surgical History:  Procedure Laterality Date   HIP SURGERY     TONSILLECTOMY      Family History  Problem Relation Age of Onset   Renal cancer Mother    Cancer Father    Diabetes Sister    Dementia Sister     Social History   Occupational History   Not on file  Tobacco Use   Smoking status: Some Days    Current packs/day: 0.25    Average packs/day: 0.4 packs/day for 56.0 years (24.0 ttl pk-yrs)    Types: Cigarettes    Start date: 1970   Smokeless tobacco: Never   Tobacco comments:    Started smoking at age 41.  1 pack a week. 05/26/2024  Vaping Use   Vaping status: Never Used  Substance and Sexual Activity   Alcohol use: Yes   Drug use: No   Sexual activity: Not on file      Physical Exam: Blood pressure 130/72, pulse 74, height 6' (1.829 m), weight 216 lb (98 kg), SpO2 95%.  Gen:      No acute distress, resting tremor.  ENT:  left nasal polyp Lungs:    No increased respiratory effort, symmetric chest wall excursion, clear to auscultation bilaterally, no wheezes or crackles CV:         Regular rate and rhythm; no murmurs, rubs, or gallops.  No pedal edema   Data Reviewed: Imaging: I have personally reviewed the bedside ultrasound which shows loculated pleural effusion, right side.   PFTs:     Latest Ref Rng & Units 06/07/2024    3:39 PM  PFT Results  FVC-Pre L 2.83   FVC-Predicted Pre % 67   FVC-Post L 2.92   FVC-Predicted Post % 69   Pre FEV1/FVC % % 79   Post FEV1/FCV % % 85   FEV1-Pre L 2.24   FEV1-Predicted Pre % 74   FEV1-Post L 2.49   DLCO uncorrected ml/min/mmHg 18.19   DLCO UNC% % 72   DLVA Predicted % 83   TLC L 5.82   TLC % Predicted % 81   RV % Predicted % 110    I have personally reviewed the patient's  PFTs and normal pulmonary function  Labs: No results found for: NA, K, CO2, GLUCOSE, BUN, CREATININE, CALCIUM, GFR, EGFR, GFRNONAA No results found for: WBC, HGB, HCT, MCV, PLT  Immunization status: Immunization History  Administered Date(s) Administered   Moderna Sars-Covid-2 Vaccination 11/17/2019, 12/15/2019   PNEUMOCOCCAL CONJUGATE-20 01/06/2024   Pneumococcal Conjugate-13 11/25/2017   Pneumococcal Polysaccharide-23 11/30/2018     Assessment:  Shortness of breath Right pleural effusion, chronic loculated History of tobacco use disorder Concern for aspiration, in the setting of dementia.  Left Nasal polyp  Plan/Recommendations:  Pulmonary function testing is normal.   Recommend starting to use the steroid nasal spray daily to help with the nasal polyp and congestion. This will help the breathing in the nose.   You still have fluid around the right lung. I recommend a chest  xray today and getting a thoracentesis in IR to send for the following studies: cell count, gramstain and culture, cytology, glucose, protein, LDH.   Depending on the results of the fluid we may need to proceed with swallow evaluation with speech therapy for aspiration.   You can stop the nebulizer treatment if it is not helping.    I spent 30 minutes on 09/20/2024 in care of this patient including face to face time and non-face to face time spent charting, review of outside records, and coordination of care including referrals, tests. Coordination for thoracentesis and communication for continuity of care.    Return to Care: Return in about 6 weeks (around 11/01/2024) for Dr Zaida.   Verdon Gore, MD Pulmonary and Critical Care Medicine [facility] Office:[phone]

## 2024-09-20 NOTE — Patient Instructions (Addendum)
 It was a pleasure to see you today!  Please schedule follow up with Dr Zaida in 6 weeks. Please call sooner [phone] if issues or concerns arise. You can also send us  a message through MyChart, but but aware that this is not to be used for urgent issues and it may take up to 5-7 days to receive a reply. Please be aware that you will likely be able to view your results before I have a chance to respond to them. Please give us  5 business days to respond to any non-urgent results.    Pulmonary function testing is normal.   Recommend starting to use the steroid nasal spray daily to help with the nasal polyp and congestion. This will help the breathing in the nose.   You still have fluid around the right lung. I recommend a chest xray today and getting a thoracentesis in IR to send for the following studies: cell count, gramstain and culture, cytology, glucose, protein, LDH.   Depending on the results of the fluid we may need to proceed with swallow evaluation with speech therapy for aspiration.   You can stop the nebulizer treatment if it is not helping.

## 2024-10-03 ENCOUNTER — Ambulatory Visit (HOSPITAL_COMMUNITY)
Admission: RE | Admit: 2024-10-03 | Discharge: 2024-10-03 | Disposition: A | Discharge status: Home / Self Care | Source: Ambulatory Visit | Attending: Internal Medicine | Admitting: Internal Medicine

## 2024-10-03 ENCOUNTER — Telehealth: Payer: Self-pay

## 2024-10-03 ENCOUNTER — Other Ambulatory Visit (HOSPITAL_COMMUNITY): Payer: Self-pay | Admitting: Student

## 2024-10-03 ENCOUNTER — Ambulatory Visit (HOSPITAL_COMMUNITY)
Admission: RE | Admit: 2024-10-03 | Discharge: 2024-10-03 | Disposition: A | Discharge status: Home / Self Care | Source: Ambulatory Visit | Attending: Student | Admitting: Student

## 2024-10-03 DIAGNOSIS — J90 Pleural effusion, not elsewhere classified: Secondary | ICD-10-CM | POA: Diagnosis present

## 2024-10-03 HISTORY — PX: IR THORACENTESIS ASP PLEURAL SPACE W/IMG GUIDE: IMG5380

## 2024-10-03 LAB — BODY FLUID CELL COUNT WITH DIFFERENTIAL
Eos, Fluid: 0 %
Lymphs, Fluid: 96 %
Monocyte-Macrophage-Serous Fluid: 2 % — ABNORMAL LOW (ref 50–90)
Neutrophil Count, Fluid: 2 % (ref 0–25)
Total Nucleated Cell Count, Fluid: 262 uL (ref 0–1000)

## 2024-10-03 LAB — GRAM STAIN: Gram Stain: NONE SEEN

## 2024-10-03 MED ORDER — LIDOCAINE HCL 1 % IJ SOLN
20.0000 mL | Freq: Once | INTRAMUSCULAR | Status: AC
  Administered 2024-10-03: 10 mL

## 2024-10-03 MED ORDER — LIDOCAINE HCL 1 % IJ SOLN
INTRAMUSCULAR | Status: AC
  Filled 2024-10-03: qty 20

## 2024-10-03 NOTE — Procedures (Signed)
 PROCEDURE SUMMARY:  Successful US  guided right thoracentesis. Yielded 270 mL of bloody fluid. Pt tolerated procedure well. No immediate complications.  Specimen was sent for labs. CXR ordered.  EBL < 5 mL  Solmon Selmer Ku PA-C 10/03/2024 10:42 AM

## 2024-10-03 NOTE — Telephone Encounter (Signed)
 Copied from CRM #[id number]. Topic: Clinical - Medical Advice >> Oct 02, 2024 11:34 AM Isabell A wrote: Reason for CRM: Spouse is requesting a call back from [place] in regard to questions for an upcoming procedure.  Callback number: [phone]    Called and spoke with the pts wife (DPR) pt had procedure this am and is recovering well. Nothing further needed.

## 2024-10-04 LAB — PROTEIN, BODY FLUID (OTHER): Total Protein, Body Fluid Other: 2.2 g/dL

## 2024-10-04 LAB — GLUCOSE, BODY FLUID OTHER: Glucose, Body Fluid Other: 18 mg/dL

## 2024-10-04 LAB — CYTOLOGY - NON PAP

## 2024-10-07 ENCOUNTER — Ambulatory Visit: Payer: Self-pay

## 2024-10-08 LAB — CULTURE, BODY FLUID W GRAM STAIN -BOTTLE: Culture: NO GROWTH

## 2024-10-11 NOTE — Progress Notes (Signed)
 Called and spoke to pt - advised of Dr. Enriqueta message. Pt verbalized understanding, NFN.

## 2024-10-11 NOTE — Progress Notes (Signed)
 ATC x1. LVMTCB

## 2024-10-11 NOTE — Telephone Encounter (Signed)
 Copied from CRM [id number]. Topic: Clinical - Lab/Test Results >> Oct 11, 2024  9:47 AM Rilla NOVAK wrote: Reason for CRM: Returning a call to [place].  Please call patient @ [phone] or call his cell phone @ [phone].   Called and spoke to pt - pt is requesting more information on the small residual effusion on the right side. He is wanting to know if that is something to be worried about. Please advise, thank you!

## 2024-10-11 NOTE — Telephone Encounter (Signed)
 Nothing to worry about at this time. It is small and chronic and doesn't seem to be related to cancer based on fluid analysus Pt will be following with Dr ZAIDA next month.

## 2024-11-03 ENCOUNTER — Other Ambulatory Visit: Payer: Self-pay

## 2024-11-03 DIAGNOSIS — J90 Pleural effusion, not elsewhere classified: Secondary | ICD-10-CM

## 2024-11-06 ENCOUNTER — Encounter

## 2024-11-06 DIAGNOSIS — J90 Pleural effusion, not elsewhere classified: Secondary | ICD-10-CM

## 2024-11-24 ENCOUNTER — Encounter
